# Patient Record
Sex: Female | Born: 1995 | Race: Black or African American | Hispanic: No | Marital: Single | Smoking: Never smoker
Health system: Southern US, Community
[De-identification: ages and names within clinical notes are randomized; demographics above are authoritative.]

## PROBLEM LIST (undated history)

## (undated) NOTE — *Deleted (*Deleted)
Meet the Provider Zoom Sessions      [facility] is now offering FREE monthly 1-hour virtual Zoom sessions for new, current, and prospective patients.        During these sessions, you can:   Learn about our practice, model of care, services   Get answers to questions about pregnancy and birth during COVID   Pick your provider's brain about anything else!    Sessions will be hosted by [facility] Nurse Practitioners, Physician Assistants, Physicians and Midwives          No registration required      2021 Dates:      All at 6pm     October 21st     November 18th   December 16th     January 20th  February 17th    To join one of these meetings, a few minutes before it is set to start:     Copy/paste the link into your web browser:  [email or web address]    OR  Scan the QR code below (open up your camera and point towards QR code; click on tab that pops up on your phone ("zoom")     

---

## 2018-03-23 NOTE — L&D Delivery Note (Signed)
OB/GYN Faculty Practice Delivery Note  Brenda Harvey is a 23 y.o. [id number] s/p VAVD at [redacted]w[redacted]d. She was admitted for IOL due to deceleration on NST, decreased FM and postdating.   ROM: 1h 63m with clear fluid GBS Status:  --Henderson Cloud (12/15 0311) Maximum Maternal Temperature: 98.7   Labor Progress: . Patient arrived at 1 cm dilation and was induced with cytotec x2 doses.   Delivery Date/Time: 03/15/2019 at [phone] Delivery: Called to room and patient was complete and pushing. Attending physician Dr Ernestina Patches called to the room due to recurrent prolonged fetal decelerations to the 60-70s. Verbal informed consent for VAVD obtained from MOB. Kiwi vacuum applied per manufacturers instructions. Head delivered in direct OA position with 3 contractions, no pop-offs. Nuchal cord present, reduced at the perineum. Vacuum removed and shoulder and body delivered in usual fashion. Infant with spontaneous cry, placed on mother's abdomen, dried and stimulated. Cord clamped x 2 after 1-minute delay, and cut by FOB. Cord blood drawn. Placenta delivered spontaneously with gentle cord traction. Fundus firm with massage and Pitocin. Labia, perineum, vagina, and cervix inspected with 2nd degree perineal laceration requiring repair w/ 3-0 Vicryl rapide.   Placenta: spontaneous, intact, 3 vessel cord Complications: fetal decelerations requiring VAVD Lacerations: 2nd degree perineal laceration requiring repair w/ 3-0 Vicryl rapide EBL: 182cc Analgesia: epidural, lidocaine   Infant: APGAR (1 MIN): 8   APGAR (5 MINS):  9   Weight: pending 1 hr skin to skin  Demetrius Revel, MD PGY-3

## 2018-08-04 LAB — OB RESULTS CONSOLE GC/CHLAMYDIA
Chlamydia: NEGATIVE
Gonorrhea: NEGATIVE

## 2018-08-04 LAB — HIV ANTIBODY (ROUTINE TESTING W REFLEX): HIV Screen 4th Generation wRfx: NONREACTIVE

## 2018-08-04 LAB — OB RESULTS CONSOLE ABO/RH: RH Type: POSITIVE

## 2018-08-04 LAB — OB RESULTS CONSOLE PLATELET COUNT: Platelets: 348

## 2018-08-04 LAB — OB RESULTS CONSOLE RPR: RPR: NONREACTIVE

## 2018-08-04 LAB — OB RESULTS CONSOLE HGB/HCT, BLOOD
HCT: 41 (ref 29–41)
Hemoglobin: 13.6

## 2018-08-04 LAB — OB RESULTS CONSOLE ANTIBODY SCREEN: Antibody Screen: NEGATIVE

## 2018-08-04 LAB — OB RESULTS CONSOLE RUBELLA ANTIBODY, IGM: Rubella: IMMUNE

## 2018-10-25 ENCOUNTER — Encounter: Payer: Self-pay | Admitting: Obstetrics and Gynecology

## 2018-12-06 ENCOUNTER — Encounter: Payer: Self-pay | Admitting: Advanced Practice Midwife

## 2018-12-06 ENCOUNTER — Other Ambulatory Visit: Payer: Self-pay

## 2018-12-06 ENCOUNTER — Ambulatory Visit (INDEPENDENT_AMBULATORY_CARE_PROVIDER_SITE_OTHER): Payer: Medicaid Other | Admitting: Advanced Practice Midwife

## 2018-12-06 VITALS — BP 118/73 | HR 104 | Temp 98.3°F | Ht 61.0 in | Wt 125.8 lb

## 2018-12-06 DIAGNOSIS — Z3A26 26 weeks gestation of pregnancy: Secondary | ICD-10-CM

## 2018-12-06 DIAGNOSIS — Z3490 Encounter for supervision of normal pregnancy, unspecified, unspecified trimester: Secondary | ICD-10-CM | POA: Insufficient documentation

## 2018-12-06 DIAGNOSIS — Z3482 Encounter for supervision of other normal pregnancy, second trimester: Secondary | ICD-10-CM | POA: Diagnosis not present

## 2018-12-06 DIAGNOSIS — D573 Sickle-cell trait: Secondary | ICD-10-CM

## 2018-12-06 DIAGNOSIS — Z833 Family history of diabetes mellitus: Secondary | ICD-10-CM

## 2018-12-06 DIAGNOSIS — O99012 Anemia complicating pregnancy, second trimester: Secondary | ICD-10-CM

## 2018-12-06 HISTORY — DX: Encounter for supervision of normal pregnancy, unspecified, unspecified trimester: Z34.90

## 2018-12-06 MED ORDER — BLOOD PRESSURE KIT DEVI: 1.0000 | 0 refills | Status: DC

## 2018-12-06 MED ORDER — PRENATE MINI 18-0.6-0.4-350 MG PO CAPS: 1.0000 | ORAL_CAPSULE | Freq: Every day | ORAL | 11 refills | Status: DC

## 2018-12-06 NOTE — Progress Notes (Addendum)
NEW OB xfer from [facility].  Needs Rx for PNV.  Declined FLU.

## 2018-12-06 NOTE — Progress Notes (Signed)
Subjective:   Brenda Harvey is a 23 y.o. G2P0010 at 61w1dby early ultrasound being seen today for her first obstetrical visit.  Her obstetrical history is significant for SAB x 1 and has Supervision of normal pregnancy, antepartum on their problem list.. Patient does intend to breast feed. Pregnancy history fully reviewed.  Patient reports no complaints.  HISTORY: OB History  Gravida Para Term Preterm AB Living  2 0 0 0 1 0  SAB TAB Ectopic Multiple Live Births  1 0 0 0 0    # Outcome Date GA Lbr Len/2nd Weight Sex Delivery Anes PTL Lv  2 Current           1 SAB  687w0d        History reviewed. No pertinent past medical history. History reviewed. No pertinent surgical history. Family History  Problem Relation Age of Onset  . Diabetes Mother   . Miscarriages / Stillbirths Sister   . Diabetes Maternal Uncle    Social History   Tobacco Use  . Smoking status: Never Smoker  . Smokeless tobacco: Never Used  Substance Use Topics  . Alcohol use: Never    Frequency: Never  . Drug use: Never   No Known Allergies No current outpatient medications on file prior to visit.   No current facility-administered medications on file prior to visit.    Exam   Vitals:   12/06/18 1020 12/06/18 1341  BP:  118/73  Pulse:  (!) 104  Temp:  98.3 F (36.8 C)  Weight:  125 lb 12.8 oz (57.1 kg)  Height: _0  (1.549 m)    Fetal Heart Rate (bpm): 154  VS reviewed, nursing note reviewed,  Constitutional: well developed, well nourished, no distress HEENT: normocephalic HEART: normal rate, heart sounds, regular rhythm RESP: normal effort, lung sounds clear and equal bilaterally Abdomen: soft Neuro: alert and oriented x 3 Skin: warm, dry Psych: affect normal Pelvic deferred: [place] earlier [place] Assessment:   Pregnancy: G2P0010 Patient Active Problem List   Diagnosis Date Noted  . Supervision of normal pregnancy, antepartum 12/06/2018     Plan:  1.  Encounter for supervision of normal pregnancy, antepartum, unspecified gravidity --Anticipatory guidance about next visits/weeks of pregnancy given.  --Reviewed safety, visitor policy, and COVID testing for scheduled IOL or and C/S and for active labor admission.  Reassurance about COVID-19 with young healthy women according to current data. Discussed possible changes to visits, including televisits, that may occur due to COVID-19.  The office remains open if pt needs to be seen and MAU is open 24 hours/day for OB emergencies.  - Enroll Patient in Babyscripts - Babyscripts Schedule Optimization - Blood Pressure Monitoring (BLOOD PRESSURE KIT) DEVI; 1 kit by Does not apply route once a week. Check BP Weekly. Large Cuff.  DX: Z13.6  Z34.86  O09.0  Dispense: 1 kit; Refill: 0 - Culture, OB Urine - Genetic Screening; Future --Pt to have genetic screening drawn with glucose testing.  - OB RESULTS CONSOLE RPR - OB RESULTS CONSOLE Rubella Antibody - OB RESULTS CONSOLE ABO/Rh - OB RESULTS CONSOLE Antibody Screen - OB RESULTS CONSOLE Hemoglobin and hematocrit, blood - OB RESULTS CONSOLE PLATELET COUNT - HIV Antibody (routine testing w rflx) - OB RESULTS CONSOLE GC/Chlamydia - [place]FM OB COMP + 14 WK; Future - Glucose tolerance, 2 hours; Future - Prenat-FeCbn-FeAsp-Meth-FA-DHA (PRENATE MINI) 18-0.6-0.4-350 MG CAPS; Take 1 capsule by mouth daily.  Dispense: 30 capsule;  Refill: 11  2. Family history of diabetes mellitus (DM) --Pt meets criteria for early glucose screen but is now 26 weeks. Will schedule routine glucose screening this week.  - Glucose tolerance, 2 hours; Future   Initial labs reviewed from prenatal records.  Pt reports discussion of genetic screening including nuchal translucency but does not believe any tests were done.   Some records brought by pt, but will request full prenatal record. Pt reports receiving care at same practice in [place] until 13 weeks. Rx written for PNV  Discussed and offered genetic screening options, including Quad screen/AFP, NIPS testing, and option to decline testing. Benefits/risks/alternatives reviewed. Pt aware that anatomy US is form of genetic screening with lower accuracy in detecting trisomies than blood work.  Pt chooses genetic screening today. NIPS: ordered. Ultrasound discussed; fetal anatomic survey: ordered. Problem list reviewed and updated. The nature of [facility] with multiple MDs and other Advanced Practice Providers was explained to patient; also emphasized that residents, students are part of our team. Routine obstetric precautions reviewed.  Return in about 4 weeks (around 01/03/2019).   Fatima Blank, CNM 12/06/18 2:39 PM

## 2018-12-06 NOTE — Patient Instructions (Signed)
Third Trimester of Pregnancy The third trimester is from week 28 through week 40 (months 7 through 9). The third trimester is a time when the unborn baby (fetus) is growing rapidly. At the end of the ninth month, the fetus is about 20 inches in length and weighs 6-10 pounds. Body changes during your third trimester Your body will continue to go through many changes during pregnancy. The changes vary from woman to woman. During the third trimester:  Your weight will continue to increase. You can expect to gain 25-35 pounds (11-16 kg) by the end of the pregnancy.  You may begin to get stretch marks on your hips, abdomen, and breasts.  You may urinate more often because the fetus is moving lower into your pelvis and pressing on your bladder.  You may develop or continue to have heartburn. This is caused by increased hormones that slow down muscles in the digestive tract.  You may develop or continue to have constipation because increased hormones slow digestion and cause the muscles that push waste through your intestines to relax.  You may develop hemorrhoids. These are swollen veins (varicose veins) in the rectum that can itch or be painful.  You may develop swollen, bulging veins (varicose veins) in your legs.  You may have increased body aches in the pelvis, back, or thighs. This is due to weight gain and increased hormones that are relaxing your joints.  You may have changes in your hair. These can include thickening of your hair, rapid growth, and changes in texture. Some women also have hair loss during or after pregnancy, or hair that feels dry or thin. Your hair will most likely return to normal after your baby is born.  Your breasts will continue to grow and they will continue to become tender. A yellow fluid (colostrum) may leak from your breasts. This is the first milk you are producing for your baby.  Your belly button may stick out.  You may notice more swelling in your hands,  face, or ankles.  You may have increased tingling or numbness in your hands, arms, and legs. The skin on your belly may also feel numb.  You may feel short of breath because of your expanding uterus.  You may have more problems sleeping. This can be caused by the size of your belly, increased need to urinate, and an increase in your body's metabolism.  You may notice the fetus "dropping," or moving lower in your abdomen (lightening).  You may have increased vaginal discharge.  You may notice your joints feel loose and you may have pain around your pelvic bone. What to expect at prenatal visits You will have prenatal exams every 2 weeks until week 36. Then you will have weekly prenatal exams. During a routine prenatal visit:  You will be weighed to make sure you and the baby are growing normally.  Your blood pressure will be taken.  Your abdomen will be measured to track your baby's growth.  The fetal heartbeat will be listened to.  Any test results from the previous visit will be discussed.  You may have a cervical check near your due date to see if your cervix has softened or thinned (effaced).  You will be tested for Group B streptococcus. This happens between 35 and 37 weeks. Your health care provider may ask you:  What your birth plan is.  How you are feeling.  If you are feeling the baby move.  If you have had any abnormal   symptoms, such as leaking fluid, bleeding, severe headaches, or abdominal cramping.  If you are using any tobacco products, including cigarettes, chewing tobacco, and electronic cigarettes.  If you have any questions. Other tests or screenings that may be performed during your third trimester include:  Blood tests that check for low iron levels (anemia).  Fetal testing to check the health, activity level, and growth of the fetus. Testing is done if you have certain medical conditions or if there are problems during the pregnancy.  Nonstress test  (NST). This test checks the health of your baby to make sure there are no signs of problems, such as the baby not getting enough oxygen. During this test, a belt is placed around your belly. The baby is made to move, and its heart rate is monitored during movement. What is false labor? False labor is a condition in which you feel small, irregular tightenings of the muscles in the womb (contractions) that usually go away with rest, changing position, or drinking water. These are called Braxton Hicks contractions. Contractions may last for hours, days, or even weeks before true labor sets in. If contractions come at regular intervals, become more frequent, increase in intensity, or become painful, you should see your health care provider. What are the signs of labor?  Abdominal cramps.  Regular contractions that start at 10 minutes apart and become stronger and more frequent with time.  Contractions that start on the top of the uterus and spread down to the lower abdomen and back.  Increased pelvic pressure and dull back pain.  A watery or bloody mucus discharge that comes from the vagina.  Leaking of amniotic fluid. This is also known as your "water breaking." It could be a slow trickle or a gush. Let your health care provider know if it has a color or strange odor. If you have any of these signs, call your health care provider right away, even if it is before your due date. Follow these instructions at home: Medicines  Follow your health care provider's instructions regarding medicine use. Specific medicines may be either safe or unsafe to take during pregnancy.  Take a prenatal vitamin that contains at least 600 micrograms (mcg) of folic acid.  If you develop constipation, try taking a stool softener if your health care provider approves. Eating and drinking   Eat a balanced diet that includes fresh fruits and vegetables, whole grains, good sources of protein such as meat, eggs, or tofu,  and low-fat dairy. Your health care provider will help you determine the amount of weight gain that is right for you.  Avoid raw meat and uncooked cheese. These carry germs that can cause birth defects in the baby.  If you have low calcium intake from food, talk to your health care provider about whether you should take a daily calcium supplement.  Eat four or five small meals rather than three large meals a day.  Limit foods that are high in fat and processed sugars, such as fried and sweet foods.  To prevent constipation: ? Drink enough fluid to keep your urine clear or pale yellow. ? Eat foods that are high in fiber, such as fresh fruits and vegetables, whole grains, and beans. Activity  Exercise only as directed by your health care provider. Most women can continue their usual exercise routine during pregnancy. Try to exercise for 30 minutes at least 5 days a week. Stop exercising if you experience uterine contractions.  Avoid heavy lifting.  Do   not exercise in extreme heat or humidity, or at high altitudes.  Wear low-heel, comfortable shoes.  Practice good posture.  You may continue to have sex unless your health care provider tells you otherwise. Relieving pain and discomfort  Take frequent breaks and rest with your legs elevated if you have leg cramps or low back pain.  Take warm sitz baths to soothe any pain or discomfort caused by hemorrhoids. Use hemorrhoid cream if your health care provider approves.  Wear a good support bra to prevent discomfort from breast tenderness.  If you develop varicose veins: ? Wear support pantyhose or compression stockings as told by your healthcare provider. ? Elevate your feet for 15 minutes, 3-4 times a day. Prenatal care  Write down your questions. Take them to your prenatal visits.  Keep all your prenatal visits as told by your health care provider. This is important. Safety  Wear your seat belt at all times when driving.  Make  a list of emergency phone numbers, including numbers for family, friends, the hospital, and police and fire departments. General instructions  Avoid cat litter boxes and soil used by cats. These carry germs that can cause birth defects in the baby. If you have a cat, ask someone to clean the litter box for you.  Do not travel far distances unless it is absolutely necessary and only with the approval of your health care provider.  Do not use hot tubs, steam rooms, or saunas.  Do not drink alcohol.  Do not use any products that contain nicotine or tobacco, such as cigarettes and e-cigarettes. If you need help quitting, ask your health care provider.  Do not use any medicinal herbs or unprescribed drugs. These chemicals affect the formation and growth of the baby.  Do not douche or use tampons or scented sanitary pads.  Do not cross your legs for long periods of time.  To prepare for the arrival of your baby: ? Take prenatal classes to understand, practice, and ask questions about labor and delivery. ? Make a trial run to the hospital. ? Visit the hospital and tour the maternity area. ? Arrange for maternity or paternity leave through employers. ? Arrange for family and friends to take care of pets while you are in the hospital. ? Purchase a rear-facing car seat and make sure you know how to install it in your car. ? Pack your hospital bag. ? Prepare the baby's nursery. Make sure to remove all pillows and stuffed animals from the baby's crib to prevent suffocation.  Visit your dentist if you have not gone during your pregnancy. Use a soft toothbrush to brush your teeth and be gentle when you floss. Contact a health care provider if:  You are unsure if you are in labor or if your water has broken.  You become dizzy.  You have mild pelvic cramps, pelvic pressure, or nagging pain in your abdominal area.  You have lower back pain.  You have persistent nausea, vomiting, or diarrhea.   You have an unusual or bad smelling vaginal discharge.  You have pain when you urinate. Get help right away if:  Your water breaks before 37 weeks.  You have regular contractions less than 5 minutes apart before 37 weeks.  You have a fever.  You are leaking fluid from your vagina.  You have spotting or bleeding from your vagina.  You have severe abdominal pain or cramping.  You have rapid weight loss or weight gain.  You have   shortness of breath with chest pain.  You notice sudden or extreme swelling of your face, hands, ankles, feet, or legs.  Your baby makes fewer than 10 movements in 2 hours.  You have severe headaches that do not go away when you take medicine.  You have vision changes. Summary  The third trimester is from week 28 through week 40, months 7 through 9. The third trimester is a time when the unborn baby (fetus) is growing rapidly.  During the third trimester, your discomfort may increase as you and your baby continue to gain weight. You may have abdominal, leg, and back pain, sleeping problems, and an increased need to urinate.  During the third trimester your breasts will keep growing and they will continue to become tender. A yellow fluid (colostrum) may leak from your breasts. This is the first milk you are producing for your baby.  False labor is a condition in which you feel small, irregular tightenings of the muscles in the womb (contractions) that eventually go away. These are called Braxton Hicks contractions. Contractions may last for hours, days, or even weeks before true labor sets in.  Signs of labor can include: abdominal cramps; regular contractions that start at 10 minutes apart and become stronger and more frequent with time; watery or bloody mucus discharge that comes from the vagina; increased pelvic pressure and dull back pain; and leaking of amniotic fluid. This information is not intended to replace advice given to you by your health  care provider. Make sure you discuss any questions you have with your health care provider. Document Released: 03/03/2001 Document Revised: 06/30/2018 Document Reviewed: 04/14/2016 Elsevier Patient Education  2020 [employer]  

## 2018-12-07 ENCOUNTER — Other Ambulatory Visit: Payer: Medicaid Other

## 2018-12-07 DIAGNOSIS — Z833 Family history of diabetes mellitus: Secondary | ICD-10-CM

## 2018-12-07 DIAGNOSIS — Z3490 Encounter for supervision of normal pregnancy, unspecified, unspecified trimester: Secondary | ICD-10-CM

## 2018-12-08 LAB — CBC
Hematocrit: 33.4 % — ABNORMAL LOW (ref 34.0–46.6)
Hemoglobin: 11.1 g/dL (ref 11.1–15.9)
MCH: 31.1 pg (ref 26.6–33.0)
MCHC: 33.2 g/dL (ref 31.5–35.7)
MCV: 94 fL (ref 79–97)
Platelets: 265 10*3/uL (ref 150–450)
RBC: 3.57 x10E6/uL — ABNORMAL LOW (ref 3.77–5.28)
RDW: 11.5 % — ABNORMAL LOW (ref 11.7–15.4)
WBC: 10.5 10*3/uL (ref 3.4–10.8)

## 2018-12-08 LAB — HIV ANTIBODY (ROUTINE TESTING W REFLEX): HIV Screen 4th Generation wRfx: NONREACTIVE

## 2018-12-08 LAB — GLUCOSE TOLERANCE, 2 HOURS W/ 1HR
Glucose, 1 hour: 129 mg/dL (ref 65–179)
Glucose, 2 hour: 97 mg/dL (ref 65–152)
Glucose, Fasting: 77 mg/dL (ref 65–91)

## 2018-12-08 LAB — RPR: RPR Ser Ql: NONREACTIVE

## 2018-12-08 MED ORDER — BLOOD PRESSURE KIT DEVI: 1.0000 | 0 refills | Status: AC

## 2018-12-08 NOTE — Addendum Note (Signed)
Addended by: Tamela Oddi on: 12/08/2018 05:05 PM   Modules accepted: Orders

## 2018-12-09 LAB — URINE CULTURE, OB REFLEX

## 2018-12-09 LAB — CULTURE, OB URINE

## 2018-12-16 ENCOUNTER — Encounter: Payer: Self-pay | Admitting: Obstetrics and Gynecology

## 2018-12-19 ENCOUNTER — Ambulatory Visit (HOSPITAL_COMMUNITY)
Admission: RE | Admit: 2018-12-19 | Discharge: 2018-12-19 | Disposition: A | Discharge status: Home / Self Care | Payer: Medicaid Other | Source: Ambulatory Visit | Attending: Obstetrics and Gynecology | Admitting: Obstetrics and Gynecology

## 2018-12-19 ENCOUNTER — Other Ambulatory Visit: Payer: Self-pay

## 2018-12-19 ENCOUNTER — Encounter: Payer: Self-pay | Admitting: Obstetrics and Gynecology

## 2018-12-19 DIAGNOSIS — Z3A28 28 weeks gestation of pregnancy: Secondary | ICD-10-CM

## 2018-12-19 DIAGNOSIS — Z3490 Encounter for supervision of normal pregnancy, unspecified, unspecified trimester: Secondary | ICD-10-CM | POA: Diagnosis present

## 2018-12-19 DIAGNOSIS — Z148 Genetic carrier of other disease: Secondary | ICD-10-CM

## 2018-12-19 DIAGNOSIS — Z363 Encounter for antenatal screening for malformations: Secondary | ICD-10-CM | POA: Diagnosis not present

## 2018-12-19 DIAGNOSIS — O359XX0 Maternal care for (suspected) fetal abnormality and damage, unspecified, not applicable or unspecified: Secondary | ICD-10-CM | POA: Diagnosis not present

## 2019-01-03 ENCOUNTER — Encounter: Payer: Self-pay | Admitting: Obstetrics

## 2019-01-03 ENCOUNTER — Telehealth (INDEPENDENT_AMBULATORY_CARE_PROVIDER_SITE_OTHER): Payer: Medicaid Other | Admitting: Obstetrics

## 2019-01-03 VITALS — BP 119/71 | HR 89

## 2019-01-03 DIAGNOSIS — O26893 Other specified pregnancy related conditions, third trimester: Secondary | ICD-10-CM

## 2019-01-03 DIAGNOSIS — Z3490 Encounter for supervision of normal pregnancy, unspecified, unspecified trimester: Secondary | ICD-10-CM

## 2019-01-03 DIAGNOSIS — M549 Dorsalgia, unspecified: Secondary | ICD-10-CM

## 2019-01-03 DIAGNOSIS — Z3A30 30 weeks gestation of pregnancy: Secondary | ICD-10-CM

## 2019-01-03 MED ORDER — COMFORT FIT MATERNITY SUPP SM MISC: 0 refills | Status: DC

## 2019-01-03 NOTE — Progress Notes (Signed)
Patient reports fetal movement, denies pain. 

## 2019-01-03 NOTE — Progress Notes (Signed)
TELEHEALTH OBSTETRICS PRENATAL VIRTUAL VIDEO VISIT ENCOUNTER NOTE  Provider location: Center for [facility] [place]   I connected with Brenda Harvey on 01/03/19 at 11:00 AM EDT by OB MyChart Video Encounter at home and verified that I am speaking with the correct person using two identifiers.   I discussed the limitations, risks, security and privacy concerns of performing an evaluation and management service virtually and the availability of in person appointments. I also discussed with the patient that there may be a patient responsible charge related to this service. The patient expressed understanding and agreed to proceed. Subjective:  Brenda Harvey is a 23 y.o. G2P0010 at [redacted]w[redacted]d being seen today for ongoing prenatal care.  She is currently monitored for the following issues for this low-risk pregnancy and has Supervision of normal pregnancy, antepartum and Sickle cell trait (HCC) on their problem list.  Patient reports backache.  Contractions: Not present. Vag. Bleeding: None.  Movement: Present. Denies any leaking of fluid.   The following portions of the patient's history were reviewed and updated as appropriate: allergies, current medications, past family history, past medical history, past social history, past surgical history and problem list.   Objective:   Vitals:   01/03/19 1112  BP: 119/71  Pulse: 89    Fetal Status:     Movement: Present     General:  Alert, oriented and cooperative. Patient is in no acute distress.  Respiratory: Normal respiratory effort, no problems with respiration noted  Mental Status: Normal mood and affect. Normal behavior. Normal judgment and thought content.  Rest of physical exam deferred due to type of encounter  Imaging: [place] Mfm Ob Comp + 14 Wk  Result Date: 12/19/2018 ----------------------------------------------------------------------  OBSTETRICS REPORT                       (Signed Final 12/19/2018 01:36 pm)  ---------------------------------------------------------------------- Patient Info  ID #:       [id number]                          D.O.B.:  1995/11/02 (22 yrs)  Name:       Brenda Harvey                Visit Date: 12/19/2018 08:58 am ---------------------------------------------------------------------- Performed By  Performed By:     Sandi Mealy        Ref. Address:     [address]                    RD[address]                                                             Rd                                                             Jacky Kindle  Attending:        Ma Rings MD         [facility]  Referred By:      Wilmer Floor LEFTWICH-                    KIRBY CNM ---------------------------------------------------------------------- Orders   #  Description                          Code         Ordered By   1  [place] MFM OB COMP + 14 WK               76805.01     LISA LEFTWICH-                                                        KIRBY  ----------------------------------------------------------------------   #  Order #                    Accession #                 Episode #   1  [id number]                  [id number]                  [id number]  ---------------------------------------------------------------------- Indications   Fetal abnormality - other known or suspected   O35.9XX0   Encounter for antenatal screening for          Z36.3   malformations (Low Risk NIPS)   Genetic carrier (Sickle Cell trait)            Z14.8   [redacted] weeks gestation of pregnancy                Z3A.28  ---------------------------------------------------------------------- Fetal Evaluation  Num Of Fetuses:         1  Fetal Heart Rate(bpm):  143  Cardiac Activity:       Observed  Presentation:           Cephalic  Placenta:               Anterior  P. Cord Insertion:      Visualized  Amniotic Fluid  AFI FV:      Within normal limits                               Largest Pocket(cm)                              8.01 ---------------------------------------------------------------------- Biometry  BPD:      70.2  mm     G. Age:  28w 1d         45  %    CI:        82.01   %    70 - 86                                                          FL/HC:      20.6   %  18.8 - 20.6  HC:      244.6  mm     G. Age:  26w 4d          2  %    HC/AC:      0.99        1.05 - 1.21  AC:      247.4  mm     G. Age:  29w 0d         72  %    FL/BPD:     71.9   %    71 - 87  FL:       50.5  mm     G. Age:  27w 1d         14  %    FL/AC:      20.4   %    20 - 24  HUM:      45.9  mm     G. Age:  27w 0d         26  %  LV:        3.9  mm  Est. FW:    1168  gm      2 lb 9 oz     39  % ---------------------------------------------------------------------- OB History  Gravidity:    2         Term:   0         SAB:   1  Living:       0 ---------------------------------------------------------------------- Gestational Age  LMP:           28w 0d        Date:  06/06/18                 EDD:   03/13/19  U/S Today:     27w 5d                                        EDD:   03/15/19  Best:          28w 0d     Det. By:  LMP  (06/06/18)          EDD:   03/13/19 ---------------------------------------------------------------------- Anatomy  Cranium:               Appears normal         Aortic Arch:            Not well visualized  Cavum:                 Appears normal         Ductal Arch:            Appears normal  Ventricles:            Appears normal         Diaphragm:              Appears normal  Choroid Plexus:        Appears normal         Stomach:                Appears normal, left  sided  Cerebellum:            Not well visualized    Abdomen:                Appears normal  Posterior Fossa:       Not well visualized    Abdominal Wall:         Appears nml (cord                                                                         insert, abd wall)  Nuchal Fold:           Not applicable (>35    Cord Vessels:           Appears normal ([redacted]                         wks GA)                                        vessel cord)  Face:                  Appears normal         Kidneys:                Not well visualized                         (orbits and profile)  Lips:                  Appears normal         Bladder:                Appears normal  Thoracic:              Appears normal         Spine:                  Not well visualized  Heart:                 Appears normal         Upper Extremities:      Appears normal                         (4CH, axis, and                         situs)  RVOT:                  Appears normal         Lower Extremities:      Appears normal  LVOT:                  Appears normal  Other:  Female gender. Nasal bone visualized. Technically difficult due to fetal          position. ---------------------------------------------------------------------- Comments  This patient was seen for a detailed fetal anatomy scan.  The  patient recently [place]  She denies any significant past medical history and denies  any problems in her current pregnancy.  She is a carrier for  the sickle cell trait.  She had a cell free DNA test earlier in her pregnancy which  indicated a low risk for trisomy 5621, 8018, and 13. A female fetus is  predicted.  She was informed that the fetal growth and amniotic fluid  level were appropriate for her gestational age.  There were no obvious fetal anomalies noted on today's  ultrasound exam.  Today's exam was limited due to her  advanced gestational age.  The patient was informed that anomalies may be missed due  to technical limitations. If the fetus is in a suboptimal position  or maternal habitus is increased, visualization of the fetus in  the maternal uterus may be impaired.  Follow up as indicated. ----------------------------------------------------------------------                    Ma RingsVictor Fang, MD Electronically Signed Final Report   12/19/2018 01:36 pm ----------------------------------------------------------------------   Assessment and Plan:  Pregnancy: G2P0010 at 5569w1d 1. Encounter for supervision of normal pregnancy, antepartum, unspecified gravidity  2. Backache symptom Rx: - Elastic Bandages & Supports (COMFORT FIT MATERNITY SUPP SM) MISC; Wear as directed.  Dispense: 1 each; Refill: 0  Preterm labor symptoms and general obstetric precautions including but not limited to vaginal bleeding, contractions, leaking of fluid and fetal movement were reviewed in detail with the patient. I discussed the assessment and treatment plan with the patient. The patient was provided an opportunity to ask questions and all were answered. The patient agreed with the plan and demonstrated an understanding of the instructions. The patient was advised to call back or seek an in-person office evaluation/[facility] for any urgent or concerning symptoms. Please refer to After Visit Summary for other counseling recommendations.   I provided 10 minutes of face-to-face time during this encounter.  Return in about 2 weeks (around 01/17/2019) for MyChart.    Coral Ceoharles Astraea Gaughran, MD [facility], [facility] 01/03/2019

## 2019-01-06 ENCOUNTER — Encounter: Payer: Medicaid Other | Admitting: Obstetrics

## 2019-01-17 ENCOUNTER — Encounter: Payer: Self-pay | Admitting: Obstetrics

## 2019-01-17 ENCOUNTER — Telehealth (INDEPENDENT_AMBULATORY_CARE_PROVIDER_SITE_OTHER): Payer: Medicaid Other | Admitting: Obstetrics

## 2019-01-17 DIAGNOSIS — Z3493 Encounter for supervision of normal pregnancy, unspecified, third trimester: Secondary | ICD-10-CM

## 2019-01-17 DIAGNOSIS — Z3490 Encounter for supervision of normal pregnancy, unspecified, unspecified trimester: Secondary | ICD-10-CM

## 2019-01-17 DIAGNOSIS — Z3A32 32 weeks gestation of pregnancy: Secondary | ICD-10-CM

## 2019-01-17 NOTE — Progress Notes (Signed)
TELEHEALTH OBSTETRICS PRENATAL VIRTUAL VIDEO VISIT ENCOUNTER NOTE  Provider location: [facility] for [facility] [place]   I connected with Brenda Harvey on 01/17/19 at  2:30 PM EDT by OB MyChart Video Encounter at home and verified that I am speaking with the correct person using two identifiers.   I discussed the limitations, risks, security and privacy concerns of performing an evaluation and management service virtually and the availability of in person appointments. I also discussed with the patient that there may be a patient responsible charge related to this service. The patient expressed understanding and agreed to proceed. Subjective:  Brenda Harvey is a 23 y.o. G2P0010 at [redacted]w[redacted]d being seen today for ongoing prenatal care.  She is currently monitored for the following issues for this low-risk pregnancy and has Supervision of normal pregnancy, antepartum and Sickle cell trait (HCC) on their problem list.  Patient reports sharp RUQ pain that lasted ~ 1 minute, and has not returned .  Contractions: Not present. Vag. Bleeding: None.  Movement: Present. Denies any leaking of fluid.   The following portions of the patient's history were reviewed and updated as appropriate: allergies, current medications, past family history, past medical history, past social history, past surgical history and problem list.   Objective:   Vitals:   01/17/19 1420  BP: 122/72  Pulse: 86    Fetal Status:     Movement: Present     General:  Alert, oriented and cooperative. Patient is in no acute distress.  Respiratory: Normal respiratory effort, no problems with respiration noted  Mental Status: Normal mood and affect. Normal behavior. Normal judgment and thought content.  Rest of physical exam deferred due to type of encounter  Imaging: [place] Mfm Ob Comp + 14 Wk  Result Date: 12/19/2018 ----------------------------------------------------------------------  OBSTETRICS REPORT                        (Signed Final 12/19/2018 01:36 pm) ---------------------------------------------------------------------- Patient Info  ID #:       [id number]                          D.O.B.:  28-Dec-1995 (22 yrs)  Name:       Brenda Harvey                Visit Date: 12/19/2018 08:58 am ---------------------------------------------------------------------- Performed By  Performed By:     Sandi Mealy        Ref. Address:     [address]                    RD[address]                                                             Rd                                                             Jacky Kindle  Attending:        Ma Rings MD  [facility]  Referred By:      Wilmer FloorLISA A LEFTWICH-                    KIRBY CNM ---------------------------------------------------------------------- Orders   #  Description                          Code         Ordered By   1  US MFM OB COMP + 14 WK               76805.01     LISA LEFTWICH-                                                        KIRBY  ----------------------------------------------------------------------   #  Order #                    Accession #                 Episode #   1  [id number]                  [id number]                  [id number]  ---------------------------------------------------------------------- Indications   Fetal abnormality - other known or suspected   O35.9XX0   Encounter for antenatal screening for          Z36.3   malformations (Low Risk NIPS)   Genetic carrier (Sickle Cell trait)            Z14.8   [redacted] weeks gestation of pregnancy                Z3A.28  ---------------------------------------------------------------------- Fetal Evaluation  Num Of Fetuses:         1  Fetal Heart Rate(bpm):  143  Cardiac Activity:       Observed  Presentation:           Cephalic  Placenta:               Anterior  P. Cord Insertion:      Visualized  Amniotic Fluid  AFI FV:       Within normal limits                              Largest Pocket(cm)                              8.01 ---------------------------------------------------------------------- Biometry  BPD:      70.2  mm     G. Age:  28w 1d         45  %    CI:        82.01   %  70 - 86                                                          FL/HC:      20.6   %    18.8 - 20.6  HC:      244.6  mm     G. Age:  26w 4d          2  %    HC/AC:      0.99        1.05 - 1.21  AC:      247.4  mm     G. Age:  29w 0d         72  %    FL/BPD:     71.9   %    71 - 87  FL:       50.5  mm     G. Age:  27w 1d         14  %    FL/AC:      20.4   %    20 - 24  HUM:      45.9  mm     G. Age:  27w 0d         26  %  LV:        3.9  mm  Est. FW:    1168  gm      2 lb 9 oz     39  % ---------------------------------------------------------------------- OB History  Gravidity:    2         Term:   0         SAB:   1  Living:       0 ---------------------------------------------------------------------- Gestational Age  LMP:           28w 0d        Date:  06/06/18                 EDD:   03/13/19  U/S Today:     27w 5d                                        EDD:   03/15/19  Best:          28w 0d     Det. By:  LMP  (06/06/18)          EDD:   03/13/19 ---------------------------------------------------------------------- Anatomy  Cranium:               Appears normal         Aortic Arch:            Not well visualized  Cavum:                 Appears normal         Ductal Arch:            Appears normal  Ventricles:            Appears normal         Diaphragm:              Appears normal  Choroid Plexus:  Appears normal         Stomach:                Appears normal, left                                                                        sided  Cerebellum:            Not well visualized    Abdomen:                Appears normal  Posterior Fossa:       Not well visualized    Abdominal Wall:         Appears nml (cord                                                                         insert, abd wall)  Nuchal Fold:           Not applicable (>20    Cord Vessels:           Appears normal ([redacted]                         wks GA)                                        vessel cord)  Face:                  Appears normal         Kidneys:                Not well visualized                         (orbits and profile)  Lips:                  Appears normal         Bladder:                Appears normal  Thoracic:              Appears normal         Spine:                  Not well visualized  Heart:                 Appears normal         Upper Extremities:      Appears normal                         (4CH, axis, and                         situs)  RVOT:                  Appears normal         Lower Extremities:      Appears normal  LVOT:                  Appears normal  Other:  Female gender. Nasal bone visualized. Technically difficult due to fetal          position. ---------------------------------------------------------------------- Comments  This patient was seen for a detailed fetal anatomy scan.  The  patient recently transferred her care from practice in [place].  She denies any significant past medical history and denies  any problems in her current pregnancy.  She is a carrier for  the sickle cell trait.  She had a cell free DNA test earlier in her pregnancy which  indicated a low risk for trisomy 74, 56, and 13. A female fetus is  predicted.  She was informed that the fetal growth and amniotic fluid  level were appropriate for her gestational age.  There were no obvious fetal anomalies noted on today's  ultrasound exam.  Today's exam was limited due to her  advanced gestational age.  The patient was informed that anomalies may be missed due  to technical limitations. If the fetus is in a suboptimal position  or maternal habitus is increased, visualization of the fetus in  the maternal uterus may be impaired.  Follow up as indicated.  ----------------------------------------------------------------------                   Ma Rings, MD Electronically Signed Final Report   12/19/2018 01:36 pm ----------------------------------------------------------------------   Assessment and Plan:  Pregnancy: G2P0010 at [redacted]w[redacted]d 1. Encounter for supervision of normal pregnancy, antepartum, unspecified gravidity   Preterm labor symptoms and general obstetric precautions including but not limited to vaginal bleeding, contractions, leaking of fluid and fetal movement were reviewed in detail with the patient. I discussed the assessment and treatment plan with the patient. The patient was provided an opportunity to ask questions and all were answered. The patient agreed with the plan and demonstrated an understanding of the instructions. The patient was advised to call back or seek an in-person office evaluation/go to MAU at [facility] for any urgent or concerning symptoms. Please refer to After Visit Summary for other counseling recommendations.   I provided 10 minutes of face-to-face time during this encounter.  Return in about 2 weeks (around 01/31/2019) for MyChart.  Future Appointments  Date Time Provider Department Center  01/17/2019  2:30 PM Brock Bad, MD [facility] None    Coral Ceo, MD Center for [facility], [facility] 01/17/2019

## 2019-01-17 NOTE — Progress Notes (Signed)
Pt is on the phone preparing for virtual visit with provider. [redacted]w[redacted]d.  

## 2019-01-20 ENCOUNTER — Telehealth: Payer: Self-pay

## 2019-01-20 NOTE — Telephone Encounter (Signed)
Contacted pt to get more info to fill out paperwork, no answer, left vm

## 2019-01-25 ENCOUNTER — Telehealth: Payer: Self-pay | Admitting: *Deleted

## 2019-01-25 NOTE — Telephone Encounter (Signed)
Attempt to contact pt regarding accomodation paperwork.  LM on VM to contact office.

## 2019-01-31 ENCOUNTER — Encounter: Payer: Self-pay | Admitting: Obstetrics

## 2019-01-31 ENCOUNTER — Telehealth (INDEPENDENT_AMBULATORY_CARE_PROVIDER_SITE_OTHER): Payer: Medicaid Other | Admitting: Obstetrics

## 2019-01-31 VITALS — BP 123/78 | HR 96

## 2019-01-31 DIAGNOSIS — Z3480 Encounter for supervision of other normal pregnancy, unspecified trimester: Secondary | ICD-10-CM

## 2019-01-31 DIAGNOSIS — Z3483 Encounter for supervision of other normal pregnancy, third trimester: Secondary | ICD-10-CM

## 2019-01-31 DIAGNOSIS — D573 Sickle-cell trait: Secondary | ICD-10-CM

## 2019-01-31 DIAGNOSIS — Z3A34 34 weeks gestation of pregnancy: Secondary | ICD-10-CM

## 2019-01-31 DIAGNOSIS — Z833 Family history of diabetes mellitus: Secondary | ICD-10-CM

## 2019-01-31 NOTE — Progress Notes (Signed)
Virtual Visit via Telephone Note  I connected with Brenda Harvey on 01/31/19 at  2:30 PM EST by telephone and verified that I am speaking with the correct person using two identifiers.  No complaints today per pt

## 2019-01-31 NOTE — Progress Notes (Signed)
   [place] VIRTUAL VIDEO VISIT ENCOUNTER NOTE  Provider location: [facility] for [facility] [place]   I connected with Marlei Demirjian on 01/31/19 at  2:30 PM EST by OB MyChart Video Encounter at home and verified that I am speaking with the correct person using two identifiers.   I discussed the limitations, risks, security and privacy concerns of performing an evaluation and management service virtually and the availability of in person appointments. I also discussed with the patient that there may be a patient responsible charge related to this service. The patient expressed understanding and agreed to proceed. Subjective:  Brenda Harvey is a 23 y.o. G2P0010 at [redacted]w[redacted]d being seen today for ongoing prenatal care.  She is currently monitored for the following issues for this low-risk pregnancy and has Supervision of normal pregnancy, antepartum and Sickle cell trait ([place]) on their problem list.  Patient reports no complaints.  Contractions: Not present. Vag. Bleeding: None.  Movement: Present. Denies any leaking of fluid.   The following portions of the patient's history were reviewed and updated as appropriate: allergies, current medications, past family history, past medical history, past social history, past surgical history and problem list.   Objective:   Vitals:   01/31/19 1432  BP: 123/78  Pulse: 96    Fetal Status:     Movement: Present     General:  Alert, oriented and cooperative. Patient is in no acute distress.  Respiratory: Normal respiratory effort, no problems with respiration noted  Mental Status: Normal mood and affect. Normal behavior. Normal judgment and thought content.  Rest of physical exam deferred due to type of encounter  Imaging: No results found.  Assessment and Plan:  Pregnancy: G2P0010 at [redacted]w[redacted]d  1. Supervision of other normal pregnancy, antepartum  2. Family history of diabetes mellitus (DM)  3. Sickle cell trait  ([place])   Preterm labor symptoms and general obstetric precautions including but not limited to vaginal bleeding, contractions, leaking of fluid and fetal movement were reviewed in detail with the patient. I discussed the assessment and treatment plan with the patient. The patient was provided an opportunity to ask questions and all were answered. The patient agreed with the plan and demonstrated an understanding of the instructions. The patient was advised to call back or seek an in-person office evaluation/go to [facility]U at [facility] for any urgent or concerning symptoms. Please refer to After Visit Summary for other counseling recommendations.   I provided 10 minutes of face-to-face time during this encounter.  Return in about 2 weeks (around 02/14/2019) for ROB.  No future appointments.  Baltazar Najjar, MD Center for [facility], [facility] 01/31/2019

## 2019-02-14 ENCOUNTER — Telehealth (INDEPENDENT_AMBULATORY_CARE_PROVIDER_SITE_OTHER): Payer: Medicaid Other | Admitting: Advanced Practice Midwife

## 2019-02-14 ENCOUNTER — Encounter: Payer: Self-pay | Admitting: Advanced Practice Midwife

## 2019-02-14 VITALS — BP 121/78 | HR 93

## 2019-02-14 DIAGNOSIS — Z3A36 36 weeks gestation of pregnancy: Secondary | ICD-10-CM

## 2019-02-14 DIAGNOSIS — U071 COVID-19: Secondary | ICD-10-CM

## 2019-02-14 DIAGNOSIS — O98513 Other viral diseases complicating pregnancy, third trimester: Secondary | ICD-10-CM

## 2019-02-14 DIAGNOSIS — Z3480 Encounter for supervision of other normal pregnancy, unspecified trimester: Secondary | ICD-10-CM

## 2019-02-14 HISTORY — DX: Other viral diseases complicating pregnancy, third trimester: O98.513

## 2019-02-14 HISTORY — DX: COVID-19: U07.1

## 2019-02-14 MED ORDER — ALBUTEROL SULFATE HFA 108 (90 BASE) MCG/ACT IN AERS: 2.0000 | INHALATION_SPRAY | Freq: Four times a day (QID) | RESPIRATORY_TRACT | 0 refills | Status: DC | PRN

## 2019-02-14 NOTE — Progress Notes (Signed)
   [place] VIRTUAL VIDEO VISIT ENCOUNTER NOTE  Provider location: [facility] at [place]   I connected with Brenda Harvey on 02/14/19 at  2:15 PM EST by Chart Video Encounter at home and verified that I am speaking with the correct person using two identifiers.   I discussed the limitations, risks, security and privacy concerns of performing an evaluation and management service virtually and the availability of in person appointments. I also discussed with the patient that there may be a patient responsible charge related to this service. The patient expressed understanding and agreed to proceed. Subjective:  Brenda Harvey is a 23 y.o. G2P0010 at [redacted]w[redacted]d being seen today for ongoing prenatal care.  She is currently monitored for the following issues for this low-risk pregnancy and has Supervision of normal pregnancy, antepartum and Sickle cell trait ([place]) on their problem list.  Patient reports mild URI symptoms plus no smell/taste.  Contractions: Not present. Vag. Bleeding: None.  Movement: Present. Denies any leaking of fluid.   The following portions of the patient's history were reviewed and updated as appropriate: allergies, current medications, past family history, past medical history, past social history, past surgical history and problem list.   Objective:   Vitals:   02/14/19 1416  BP: 121/78  Pulse: 93    Fetal Status:     Movement: Present     General:  Alert, oriented and cooperative. Patient is in no acute distress.  Respiratory: Normal respiratory effort, no problems with respiration noted  Mental Status: Normal mood and affect. Normal behavior. Normal judgment and thought content.  Rest of physical exam deferred due to type of encounter  Imaging: No results found.  Assessment and Plan:  Pregnancy: G2P0010 at [redacted]w[redacted]d 1. Lab test positive for detection of COVID-19 virus --pt boyfriend positive first, she has mild cold  symptoms plus no taste/smell. --Reassurance provided that symptoms are likely to remain mild, reviewed where to go if symptoms worsen, and safe medications in pregnancy. --Rx for albuterol inhaler PRN. Pt does not currently need and may pick up if she becomes short of breath.  2. COVID-19 affecting pregnancy in third trimester --Today's visit changed to virtual, pt called/sent message to notify office of her positive test  3. Supervision of other normal pregnancy, antepartum --Pt reports good fetal movement, denies cramping, LOF, or vaginal bleeding --Anticipatory guidance about next visits/weeks of pregnancy given. --Next appt in 2 weeks, at 38 weeks, will need to do GBS/GC at that time.   Term labor symptoms and general obstetric precautions including but not limited to vaginal bleeding, contractions, leaking of fluid and fetal movement were reviewed in detail with the patient. I discussed the assessment and treatment plan with the patient. The patient was provided an opportunity to ask questions and all were answered. The patient agreed with the plan and demonstrated an understanding of the instructions. The patient was advised to call back or seek an in-person office evaluation/go to [facility]U at [facility] for any urgent or concerning symptoms. Please refer to After Visit Summary for other counseling recommendations.   I provided 15 minutes of face-to-face time during this encounter.  Return in about 2 weeks (around 02/28/2019).  No future appointments.  Fatima Blank, [place] for [employer], [place]

## 2019-02-16 ENCOUNTER — Other Ambulatory Visit: Payer: Self-pay

## 2019-02-16 ENCOUNTER — Inpatient Hospital Stay (HOSPITAL_COMMUNITY)
Admission: AD | Admit: 2019-02-16 | Discharge: 2019-02-16 | Disposition: A | Discharge status: Home / Self Care | Payer: Medicaid Other | Attending: Obstetrics and Gynecology | Admitting: Obstetrics and Gynecology

## 2019-02-16 ENCOUNTER — Encounter (HOSPITAL_COMMUNITY): Payer: Self-pay | Admitting: *Deleted

## 2019-02-16 DIAGNOSIS — R42 Dizziness and giddiness: Secondary | ICD-10-CM | POA: Insufficient documentation

## 2019-02-16 DIAGNOSIS — O26893 Other specified pregnancy related conditions, third trimester: Secondary | ICD-10-CM | POA: Diagnosis not present

## 2019-02-16 DIAGNOSIS — Z833 Family history of diabetes mellitus: Secondary | ICD-10-CM | POA: Diagnosis not present

## 2019-02-16 DIAGNOSIS — O99891 Other specified diseases and conditions complicating pregnancy: Secondary | ICD-10-CM | POA: Insufficient documentation

## 2019-02-16 DIAGNOSIS — Z3A33 33 weeks gestation of pregnancy: Secondary | ICD-10-CM

## 2019-02-16 DIAGNOSIS — U071 COVID-19: Secondary | ICD-10-CM | POA: Diagnosis not present

## 2019-02-16 DIAGNOSIS — R109 Unspecified abdominal pain: Secondary | ICD-10-CM

## 2019-02-16 DIAGNOSIS — O98513 Other viral diseases complicating pregnancy, third trimester: Secondary | ICD-10-CM | POA: Diagnosis not present

## 2019-02-16 DIAGNOSIS — O26899 Other specified pregnancy related conditions, unspecified trimester: Secondary | ICD-10-CM

## 2019-02-16 DIAGNOSIS — Z3A36 36 weeks gestation of pregnancy: Secondary | ICD-10-CM | POA: Insufficient documentation

## 2019-02-16 DIAGNOSIS — Z3689 Encounter for other specified antenatal screening: Secondary | ICD-10-CM | POA: Insufficient documentation

## 2019-02-16 LAB — URINALYSIS, ROUTINE W REFLEX MICROSCOPIC
Bacteria, UA: NONE SEEN
Bilirubin Urine: NEGATIVE
Glucose, UA: NEGATIVE mg/dL
Ketones, ur: 5 mg/dL — AB
Nitrite: NEGATIVE
Protein, ur: NEGATIVE mg/dL
Specific Gravity, Urine: 1.011 (ref 1.005–1.030)
pH: 6 (ref 5.0–8.0)

## 2019-02-16 MED ORDER — LACTATED RINGERS IV SOLN
Freq: Once | INTRAVENOUS | Status: AC
  Administered 2019-02-16: 15:00:00 via INTRAVENOUS

## 2019-02-16 NOTE — MAU Note (Addendum)
.   Brenda Harvey is a 23 y.o. at [redacted]w[redacted]d here in MAU reporting:abdominal pain that started this morning around 10am   Denies any VB or LOF. +FM. Pt complains to dizziness at times  LMP Onset of complaint: 10am Pain score: 8 Vitals:   02/16/19 1409  BP: 120/81  Pulse: (!) 118  Resp: 16  Temp: 98 F (36.7 C)  SpO2: 99%     [id number] Lab orders placed from triage:

## 2019-02-16 NOTE — MAU Provider Note (Signed)
History     CSN: [id number]  Arrival date and time: 02/16/19 1352   First Provider Initiated Contact with Patient 02/16/19 1447      Chief Complaint  Patient presents with  . Abdominal Pain  . Dizziness   HPI Brenda Harvey 23 y.o.  [redacted]w[redacted]d Comes to MAU today with lower abdominal pain that started after she ate breakfast today.  Awakened at 10 am and having some lower abdominal pain.  Ate 2 bowls of cereal and had milk to drink.  Pain worsened.  She was having nausea, dizziness and continued abdominal pain.  No bleeding.  No vaginal leaking.  Spent time resting on the floor at home.  Writhing when she came into MAU.    FOB has tested positive for Covid and client had a test at [facility] on 02-09-19 that was positive Covid.  In negative pressure room in MAU.  Staff wearing appropriate PPE.  Patient Active Problem List   Diagnosis Date Noted  . Lab test positive for detection of COVID-19 virus 02/14/2019  . COVID-19 affecting pregnancy in third trimester 02/14/2019  . Supervision of normal pregnancy, antepartum 12/06/2018  . Sickle cell trait ([place] 12/06/2018  ]  OB History    Gravida  2   Para  0   Term      Preterm      AB  1   Living        SAB  1   TAB      Ectopic      Multiple      Live Births              History reviewed. No pertinent past medical history.  History reviewed. No pertinent surgical history.  Family History  Problem Relation Age of Onset  . Diabetes Mother   . Miscarriages / Stillbirths Sister   . Diabetes Maternal Uncle     Social History   Tobacco Use  . Smoking status: Never Smoker  . Smokeless tobacco: Never Used  Substance Use Topics  . Alcohol use: Never    Frequency: Never  . Drug use: Never    Allergies: No Known Allergies  Medications Prior to Admission  Medication Sig Dispense Refill Last Dose  . Prenat-FeCbn-FeAsp-Meth-FA-DHA (PRENATE MINI) 18-0.6-0.4-350 MG CAPS Take 1 capsule by mouth daily. 30 capsule 11  02/16/2019 at Unknown time  . albuterol (VENTOLIN HFA) 108 (90 Base) MCG/ACT inhaler Inhale 2 puffs into the lungs every 6 (six) hours as needed for wheezing or shortness of breath. 6.7 g 0   . Blood Pressure Monitoring (BLOOD PRESSURE KIT) DEVI 1 kit by Does not apply route once a week. Check BP Weekly. Large Cuff.  DX: Z13.6  Z34.86  O09.0 (Patient not taking: Reported on 01/31/2019) 1 kit 0   . Elastic Bandages & Supports (COMFORT FIT MATERNITY SUPP SM) MISC Wear as directed. (Patient not taking: Reported on 01/31/2019) 1 each 0     Review of Systems  Constitutional: Negative for fever.  Respiratory: Negative for cough, chest tightness and shortness of breath.   Gastrointestinal: Positive for abdominal pain and nausea. Negative for diarrhea and vomiting.  Genitourinary: Negative for dysuria, vaginal bleeding and vaginal discharge.  Musculoskeletal: Negative for back pain.   Physical Exam   Blood pressure 120/81, pulse (!) 118, temperature 98 F (36.7 C), resp. rate 16, last menstrual period 06/06/2018, SpO2 99 %.  Physical Exam  Nursing note and vitals reviewed. Constitutional: She is oriented to person, place,  and time. She appears well-developed and well-nourished.  HENT:  Head: Normocephalic.  Eyes: EOM are normal.  Neck: Neck supple.  Cardiovascular: Normal rate, regular rhythm and normal heart sounds.  Respiratory: Effort normal and breath sounds normal. No respiratory distress. She has no wheezes.  GI: Soft. Bowel sounds are normal. There is abdominal tenderness. There is no rebound and no guarding.  Mild lower abdominal tenderness bilaterally.  No pubic symphysis tenderness.  Genitourinary:    Genitourinary Comments: Fetal baseline 140 with moderate variability.  15x15 accels noted.  No decelerations.  No contractions. Reactive NST Cervix checked by RN - closed, 50%   Musculoskeletal: Normal range of motion.  Neurological: She is alert and oriented to person, place, and  time.  Skin: Skin is warm and dry.  Psychiatric: She has a normal mood and affect.    MAU Course  Procedures Labs Results for orders placed or performed during the hospital encounter of 02/16/19 (from the past 24 hour(s))  Urinalysis, Routine w reflex microscopic     Status: Abnormal   Collection Time: 02/16/19  3:13 PM  Result Value Ref Range   Color, Urine YELLOW YELLOW   APPearance HAZY (A) CLEAR   Specific Gravity, Urine 1.011 1.005 - 1.030   pH 6.0 5.0 - 8.0   Glucose, UA NEGATIVE NEGATIVE mg/dL   Hgb urine dipstick LARGE (A) NEGATIVE   Bilirubin Urine NEGATIVE NEGATIVE   Ketones, ur 5 (A) NEGATIVE mg/dL   Protein, ur NEGATIVE NEGATIVE mg/dL   Nitrite NEGATIVE NEGATIVE   Leukocytes,Ua TRACE (A) NEGATIVE   RBC / HPF 0-5 0 - 5 RBC/hpf   WBC, UA 0-5 0 - 5 WBC/hpf   Bacteria, UA NONE SEEN NONE SEEN   Squamous Epithelial / LPF 0-5 0 - 5   Mucus PRESENT    Hyaline Casts, UA PRESENT      MDM Unable to urinate on arrival to MAU.  RN checked client - FT. Started IVF - 1000cc LR.  After 300 cc infused, client much calmer.  Reviewed contractions with her.  Baby has been moving well today. Client much better after fluids infused.  Abdominal pain has resolved.  Is not in labor.  Likely she was dehydrated.  Checking urine to make sure there is no UTI.  Advised to continue to drink fluids and rest as she is still recovering from Covid. Urinalysis reviewed - urine obtained after IVF infused.  Likely client was dehydrated - hyaline casts present.    Assessment and Plan  Abdominal pain in pregnancy - resolved at time of discharge Reactive NST Positive Covid test on 02-09-19  Plan IVF infused. Keep your appointments in the office as scheduled. Drink at least 8 8-oz glasses of water every day. Eat regular meals and snacks with protein every time you eat. Return if your baby is not moving well, or if you water breaks or if you are having vaginal bleeding. Stay quarantined at home for  at least 3 more days.  [name] 02/16/2019, 2:48 PM

## 2019-02-16 NOTE — Discharge Instructions (Signed)
Keep your appointments in the office as scheduled. Drink at least 8 8-oz glasses of water every day. Eat regular meals and snacks with protein every time you eat. Return if your baby is not moving well, or if you water breaks or if you are having vaginal bleeding. Stay quarantined at home for at least 3 more days.

## 2019-02-27 ENCOUNTER — Other Ambulatory Visit: Payer: Self-pay

## 2019-02-27 ENCOUNTER — Telehealth (INDEPENDENT_AMBULATORY_CARE_PROVIDER_SITE_OTHER): Payer: Medicaid Other | Admitting: Obstetrics and Gynecology

## 2019-02-27 ENCOUNTER — Encounter: Payer: Self-pay | Admitting: Obstetrics and Gynecology

## 2019-02-27 VITALS — BP 120/80 | HR 102 | Ht 61.0 in

## 2019-02-27 DIAGNOSIS — U071 COVID-19: Secondary | ICD-10-CM

## 2019-02-27 DIAGNOSIS — Z3A38 38 weeks gestation of pregnancy: Secondary | ICD-10-CM

## 2019-02-27 DIAGNOSIS — Z3400 Encounter for supervision of normal first pregnancy, unspecified trimester: Secondary | ICD-10-CM

## 2019-02-27 DIAGNOSIS — D573 Sickle-cell trait: Secondary | ICD-10-CM

## 2019-02-27 DIAGNOSIS — O98513 Other viral diseases complicating pregnancy, third trimester: Secondary | ICD-10-CM

## 2019-02-27 NOTE — Progress Notes (Signed)
   [place] VIRTUAL VIDEO VISIT ENCOUNTER NOTE  Provider location: [facility] [place] at [place]   I connected with Margrit Albergo on 02/27/19 at  1:45 PM EST by MyChart Video Encounter at home and verified that I am speaking with the correct person using two identifiers.   I discussed the limitations, risks, security and privacy concerns of performing an evaluation and management service virtually and the availability of in person appointments. I also discussed with the patient that there may be a patient responsible charge related to this service. The patient expressed understanding and agreed to proceed. Subjective:  Brenda Harvey is a 23 y.o. G2P0010 at [redacted]w[redacted]d being seen today for ongoing prenatal care.  She is currently monitored for the following issues for this low-risk pregnancy and has Supervision of normal pregnancy, antepartum; Sickle cell trait ([place]); Lab test positive for detection of COVID-19 virus; and COVID-19 affecting pregnancy in third trimester on their problem list.  Patient reports still wtih slight cough after COVID-19 diagnosis, otherwise feeling well.  Contractions: Not present. Vag. Bleeding: None.  Movement: Present. Denies any leaking of fluid.   The following portions of the patient's history were reviewed and updated as appropriate: allergies, current medications, past family history, past medical history, past social history, past surgical history and problem list.   Objective:   Vitals:   02/27/19 1329  BP: 120/80  Pulse: (!) 102  Height: 5\' 1"  (1.549 m)    Fetal Status:     Movement: Present     General:  Alert, oriented and cooperative. Patient is in no acute distress.  Respiratory: Normal respiratory effort, no problems with respiration noted  Mental Status: Normal mood and affect. Normal behavior. Normal judgment and thought content.  Rest of physical exam deferred due to type of encounter  Imaging: No results  found.  Assessment and Plan:  Pregnancy: G2P0010 at [redacted]w[redacted]d 1. Supervision of normal first pregnancy, antepartum Swabs next visit  2. Sickle cell trait ([place])  3. Lab test positive for detection of COVID-19 virus Positive 02/12/19 Slight cough today, no other symptoms  Term labor symptoms and general obstetric precautions including but not limited to vaginal bleeding, contractions, leaking of fluid and fetal movement were reviewed in detail with the patient. I discussed the assessment and treatment plan with the patient. The patient was provided an opportunity to ask questions and all were answered. The patient agreed with the plan and demonstrated an understanding of the instructions. The patient was advised to call back or seek an in-person office evaluation/go to MAU at [facility] for any urgent or concerning symptoms. Please refer to After Visit Summary for other counseling recommendations.   I provided 15 minutes of face-to-face time during this encounter.  Return in about 1 week (around 03/06/2019) for low OB, in person, 36 week swabs.  No future appointments.  Sloan Leiter, MD [facility] for [place], [place]

## 2019-02-27 NOTE — Progress Notes (Signed)
I connected with  Brenda Harvey on 02/27/19 by a video enabled telemedicine application and verified that I am speaking with the correct person using two identifiers.   I discussed the limitations of evaluation and management by telemedicine. The patient expressed understanding and agreed to proceed.  MyChart OB. Needs GBS.  Reports no problems today.

## 2019-03-07 ENCOUNTER — Ambulatory Visit (INDEPENDENT_AMBULATORY_CARE_PROVIDER_SITE_OTHER): Payer: Medicaid Other | Admitting: Obstetrics & Gynecology

## 2019-03-07 ENCOUNTER — Other Ambulatory Visit (HOSPITAL_COMMUNITY)
Admission: RE | Admit: 2019-03-07 | Discharge: 2019-03-07 | Disposition: A | Discharge status: Home / Self Care | Payer: Medicaid Other | Source: Ambulatory Visit | Attending: Obstetrics & Gynecology | Admitting: Obstetrics & Gynecology

## 2019-03-07 ENCOUNTER — Other Ambulatory Visit: Payer: Self-pay

## 2019-03-07 VITALS — BP 113/75 | HR 91 | Temp 97.5°F | Wt 135.6 lb

## 2019-03-07 DIAGNOSIS — Z23 Encounter for immunization: Secondary | ICD-10-CM

## 2019-03-07 DIAGNOSIS — Z3403 Encounter for supervision of normal first pregnancy, third trimester: Secondary | ICD-10-CM | POA: Diagnosis not present

## 2019-03-07 DIAGNOSIS — Z3A39 39 weeks gestation of pregnancy: Secondary | ICD-10-CM

## 2019-03-07 DIAGNOSIS — Z3400 Encounter for supervision of normal first pregnancy, unspecified trimester: Secondary | ICD-10-CM | POA: Insufficient documentation

## 2019-03-07 NOTE — Progress Notes (Signed)
   PRENATAL VISIT NOTE  Subjective:  Brenda Harvey is a 23 y.o. G2P0010 at [redacted]w[redacted]d being seen today for ongoing prenatal care.  She is currently monitored for the following issues for this low-risk pregnancy and has Supervision of normal pregnancy, antepartum; Sickle cell trait ([place]); Lab test positive for detection of COVID-19 virus; and COVID-19 affecting pregnancy in third trimester on their problem list.  Patient reports no complaints.  Contractions: Irritability. Vag. Bleeding: None.  Movement: Present. Denies leaking of fluid.   The following portions of the patient's history were reviewed and updated as appropriate: allergies, current medications, past family history, past medical history, past social history, past surgical history and problem list.   Objective:   Vitals:   03/07/19 1453  BP: 113/75  Pulse: 91  Temp: (!) 97.5 F (36.4 C)  Weight: 135 lb 9.6 oz (61.5 kg)    Fetal Status:     Movement: Present     General:  Alert, oriented and cooperative. Patient is in no acute distress.  Skin: Skin is warm and dry. No rash noted.   Cardiovascular: Normal heart rate noted  Respiratory: Normal respiratory effort, no problems with respiration noted  Abdomen: Soft, gravid, appropriate for gestational age.  Pain/Pressure: Absent     Pelvic: Cervical exam performed        Extremities: Normal range of motion.  Edema: None  Mental Status: Normal mood and affect. Normal behavior. Normal judgment and thought content.   Assessment and Plan:  Pregnancy: G2P0010 at [redacted]w[redacted]d 1. Supervision of normal first pregnancy, antepartum  - GBS - Cervicovaginal ancillary only( [place]) - Tdap vaccine greater than or equal to 7yo IM - Flu Vaccine QUAD 36+ mos IM (Fluarix, Quad PF)  Preterm labor symptoms and general obstetric precautions including but not limited to vaginal bleeding, contractions, leaking of fluid and fetal movement were reviewed in detail with the patient. Please refer to  After Visit Summary for other counseling recommendations.   No follow-ups on file.  No future appointments.  Emily Filbert, MD

## 2019-03-07 NOTE — Progress Notes (Signed)
Pt is here for ROB. [redacted]w[redacted]d.  

## 2019-03-08 LAB — CERVICOVAGINAL ANCILLARY ONLY
Chlamydia: NEGATIVE
Comment: NEGATIVE
Comment: NORMAL
Neisseria Gonorrhea: NEGATIVE

## 2019-03-09 LAB — STREP GP B NAA: Strep Gp B NAA: NEGATIVE

## 2019-03-14 ENCOUNTER — Inpatient Hospital Stay (HOSPITAL_COMMUNITY)
Admission: AD | Admit: 2019-03-14 | Discharge: 2019-03-17 | DRG: 807 | Disposition: A | Discharge status: Home / Self Care | Payer: Medicaid Other | Attending: Family Medicine | Admitting: Family Medicine

## 2019-03-14 ENCOUNTER — Other Ambulatory Visit: Payer: Self-pay

## 2019-03-14 ENCOUNTER — Encounter: Payer: Self-pay | Admitting: Obstetrics and Gynecology

## 2019-03-14 ENCOUNTER — Encounter (HOSPITAL_COMMUNITY): Payer: Self-pay | Admitting: Obstetrics and Gynecology

## 2019-03-14 ENCOUNTER — Ambulatory Visit (INDEPENDENT_AMBULATORY_CARE_PROVIDER_SITE_OTHER): Payer: Medicaid Other | Admitting: Obstetrics and Gynecology

## 2019-03-14 VITALS — BP 122/83 | HR 103 | Temp 98.7°F | Wt 137.2 lb

## 2019-03-14 DIAGNOSIS — Z3A40 40 weeks gestation of pregnancy: Secondary | ICD-10-CM

## 2019-03-14 DIAGNOSIS — D573 Sickle-cell trait: Secondary | ICD-10-CM | POA: Diagnosis present

## 2019-03-14 DIAGNOSIS — U071 COVID-19: Secondary | ICD-10-CM

## 2019-03-14 DIAGNOSIS — O9902 Anemia complicating childbirth: Secondary | ICD-10-CM | POA: Diagnosis present

## 2019-03-14 DIAGNOSIS — O480 Post-term pregnancy: Secondary | ICD-10-CM | POA: Diagnosis present

## 2019-03-14 DIAGNOSIS — O368130 Decreased fetal movements, third trimester, not applicable or unspecified: Secondary | ICD-10-CM | POA: Diagnosis present

## 2019-03-14 DIAGNOSIS — Z8619 Personal history of other infectious and parasitic diseases: Secondary | ICD-10-CM

## 2019-03-14 DIAGNOSIS — Z3400 Encounter for supervision of normal first pregnancy, unspecified trimester: Secondary | ICD-10-CM

## 2019-03-14 DIAGNOSIS — O98513 Other viral diseases complicating pregnancy, third trimester: Secondary | ICD-10-CM

## 2019-03-14 DIAGNOSIS — O99113 Other diseases of the blood and blood-forming organs and certain disorders involving the immune mechanism complicating pregnancy, third trimester: Secondary | ICD-10-CM

## 2019-03-14 LAB — CBC
HCT: 33.3 % — ABNORMAL LOW (ref 36.0–46.0)
Hemoglobin: 11.2 g/dL — ABNORMAL LOW (ref 12.0–15.0)
MCH: 28.9 pg (ref 26.0–34.0)
MCHC: 33.6 g/dL (ref 30.0–36.0)
MCV: 86 fL (ref 80.0–100.0)
Platelets: 268 10*3/uL (ref 150–400)
RBC: 3.87 MIL/uL (ref 3.87–5.11)
RDW: 12.8 % (ref 11.5–15.5)
WBC: 8.7 10*3/uL (ref 4.0–10.5)
nRBC: 0 % (ref 0.0–0.2)

## 2019-03-14 MED ORDER — LIDOCAINE HCL (PF) 1 % IJ SOLN
30.0000 mL | INTRAMUSCULAR | Status: AC | PRN
  Administered 2019-03-15: 30 mL via SUBCUTANEOUS
  Filled 2019-03-14: qty 30

## 2019-03-14 MED ORDER — OXYTOCIN 40 UNITS IN NORMAL SALINE INFUSION - SIMPLE MED
2.5000 [IU]/h | INTRAVENOUS | Status: DC
  Filled 2019-03-14: qty 1000

## 2019-03-14 MED ORDER — OXYCODONE-ACETAMINOPHEN 5-325 MG PO TABS: 2.0000 | ORAL_TABLET | ORAL | Status: DC | PRN

## 2019-03-14 MED ORDER — ZOLPIDEM TARTRATE 5 MG PO TABS: 5.0000 mg | ORAL_TABLET | Freq: Every evening | ORAL | Status: DC | PRN

## 2019-03-14 MED ORDER — SOD CITRATE-CITRIC ACID 500-334 MG/5ML PO SOLN: 30.0000 mL | ORAL | Status: DC | PRN

## 2019-03-14 MED ORDER — OXYCODONE-ACETAMINOPHEN 5-325 MG PO TABS: 1.0000 | ORAL_TABLET | ORAL | Status: DC | PRN

## 2019-03-14 MED ORDER — LACTATED RINGERS IV SOLN
500.0000 mL | INTRAVENOUS | Status: DC | PRN
  Administered 2019-03-15: 500 mL via INTRAVENOUS
  Administered 2019-03-15: 1000 mL via INTRAVENOUS

## 2019-03-14 MED ORDER — ONDANSETRON HCL 4 MG/2ML IJ SOLN
4.0000 mg | Freq: Four times a day (QID) | INTRAMUSCULAR | Status: DC | PRN
  Administered 2019-03-15: 4 mg via INTRAVENOUS
  Filled 2019-03-14 (×2): qty 2

## 2019-03-14 MED ORDER — LACTATED RINGERS IV SOLN: INTRAVENOUS | Status: DC

## 2019-03-14 MED ORDER — ACETAMINOPHEN 325 MG PO TABS: 650.0000 mg | ORAL_TABLET | ORAL | Status: DC | PRN

## 2019-03-14 MED ORDER — TERBUTALINE SULFATE 1 MG/ML IJ SOLN: 0.2500 mg | Freq: Once | INTRAMUSCULAR | Status: DC | PRN

## 2019-03-14 MED ORDER — OXYTOCIN BOLUS FROM INFUSION
500.0000 mL | Freq: Once | INTRAVENOUS | Status: AC
  Administered 2019-03-15: 500 mL via INTRAVENOUS

## 2019-03-14 MED ORDER — MISOPROSTOL 25 MCG QUARTER TABLET
25.0000 ug | ORAL_TABLET | ORAL | Status: DC | PRN
  Administered 2019-03-14 – 2019-03-15 (×2): 25 ug via VAGINAL
  Filled 2019-03-14 (×2): qty 1

## 2019-03-14 NOTE — H&P (Signed)
OBSTETRIC ADMISSION HISTORY AND PHYSICAL  Brenda Harvey is a 23 y.o. female G2P0010 with IUP at 18w1dby 9 wk [place] for IOL due to postdates and deceleration on NST and decreased FM. She reports +FMs, No LOF, no VB, no blurry vision, headaches or peripheral edema, and RUQ pain.  She plans on breast feeding. She is undecided about birth control, knows options.  [facility] Dating: By 9 wk [place]--->  Estimated Date of Delivery: 03/13/19  Sono:    '@[redacted]w[redacted]d' , CWD, normal anatomy, cephalic presentation, anterior placental lie, 1168g, 39% EFW   Prenatal History/Complications: Sickle cell trait  Covid +   Past Medical History: History reviewed. No pertinent past medical history.  Past Surgical History: History reviewed. No pertinent surgical history.  Obstetrical History: OB History    Gravida  2   Para  0   Term      Preterm      AB  1   Living        SAB  1   TAB      Ectopic      Multiple      Live Births              Social History: Social History   Socioeconomic History  . Marital status: Single    Spouse name: Not on file  . Number of children: Not on file  . Years of education: Not on file  . Highest education level: Not on file  Occupational History  . Occupation: unemployed  Tobacco Use  . Smoking status: Never Smoker  . Smokeless tobacco: Never Used  Substance and Sexual Activity  . Alcohol use: Never  . Drug use: Never  . Sexual activity: Yes  Other Topics Concern  . Not on file  Social History Narrative  . Not on file   Social Determinants of Health   Financial Resource Strain:   . Difficulty of Paying Living Expenses: Not on file  Food Insecurity:   . Worried About RCharity fundraiserin the Last Year: Not on file  . Ran Out of Food in the Last Year: Not on file  Transportation Needs:   . Lack of Transportation (Medical): Not on file  . Lack of Transportation (Non-Medical): Not on file   Physical Activity:   . Days of Exercise per Week: Not on file  . Minutes of Exercise per Session: Not on file  Stress:   . Feeling of Stress : Not on file  Social Connections:   . Frequency of Communication with Friends and Family: Not on file  . Frequency of Social Gatherings with Friends and Family: Not on file  . Attends Religious Services: Not on file  . Active Member of Clubs or Organizations: Not on file  . Attends CArchivistMeetings: Not on file  . Marital Status: Not on file    Family History: Family History  Problem Relation Age of Onset  . Diabetes Mother   . Miscarriages / Stillbirths Sister   . Diabetes Maternal Uncle     Allergies: No Known Allergies  Medications Prior to Admission  Medication Sig Dispense Refill Last Dose  . albuterol (VENTOLIN HFA) 108 (90 Base) MCG/ACT inhaler Inhale 2 puffs into the lungs every 6 (six) hours as needed for wheezing or shortness of breath. (Patient not taking: Reported on 03/14/2019) 6.7 g 0   . Blood Pressure Monitoring (BLOOD PRESSURE KIT) DEVI 1 kit  by Does not apply route once a week. Check BP Weekly. Large Cuff.  DX: Z13.6  Z34.86  O09.0 1 kit 0   . Elastic Bandages & Supports (COMFORT FIT MATERNITY SUPP SM) MISC Wear as directed. (Patient not taking: Reported on 03/14/2019) 1 each 0   . Prenat-FeCbn-FeAsp-Meth-FA-DHA (PRENATE MINI) 18-0.6-0.4-350 MG CAPS Take 1 capsule by mouth daily. 30 capsule 11      Review of Systems   All systems reviewed and negative except as stated in HPI  Blood pressure 121/82, pulse 92, temperature 98.5 F (36.9 C), resp. rate 16, height '5\' 1"'  (1.549 m), weight 61.2 kg, last menstrual period 06/06/2018. General appearance: alert, cooperative and no distress Lungs: clear to auscultation bilaterally Heart: regular rate and rhythm Abdomen: soft, non-tender; bowel sounds normal Pelvic: 1/50//-2 Extremities: Homans sign is negative, no sign of DVT DTR's 2+ Presentation:  cephalic Fetal monitoringBaseline: 135 bpm, Variability: Good {> 6 bpm), Accelerations: Reactive and Decelerations: Absent Uterine activityNone    Prenatal labs: ABO, Rh: --/--/PENDING (12/22 2025) Antibody: PENDING (12/22 2025) Rubella: Immune (05/14 0000) RPR: Non Reactive (09/16 0914)  HBsAg:   nonreactive HIV: Non Reactive (09/16 0914)  GBS: --Henderson Cloud (12/15 0311)  1 hr Glucola normal Genetic screening  Low risk Anatomy US normal   Nursing Staff Provider  Office Location  [facility] Dating  9 week [place]  Language   English Anatomy US  wnl  Flu Vaccine   given 03/07/19 Genetic Screen  NIPS: Low risk female  AFP: late     TDaP vaccine   given 03/07/19 Hgb A1C or  GTT Early  Meets criteria but late Third trimester 2 hour wnl  Rhogam     LAB RESULTS   Feeding Plan  Breastfeed Blood Type AB/Positive/-- (05/14 0000)   Contraception  Undecided Antibody Negative (05/14 0000)  Circumcision  No Rubella Immune (05/14 0000)  Pediatrician   Undecided RPR Non Reactive (09/16 0914)   Support Person  Rashaad FOB HBsAg     Prenatal Classes  Yes HIV Non Reactive (09/16 0914)  BTL Consent  GBS --Henderson Cloud (12/15 0311)negtative  VBAC Consent  Pap Records requested    Hgb Electro  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  BP Cuff  ordered 12/06/2018 CF neg    SMA 3 copies    Waterbirth  '[ ]'  Class '[ ]'  Consent '[ ]'  CNM visit    Prenatal Transfer Tool  Maternal Diabetes: No Genetic Screening: Normal Maternal Ultrasounds/Referrals: Normal Fetal Ultrasounds or other Referrals:  None Maternal Substance Abuse:  No Significant Maternal Medications:  None Significant Maternal Lab Results: Group B Strep negative  Results for orders placed or performed during the hospital encounter of 03/14/19 (from the past 24 hour(s))  Type and screen   Collection Time: 03/14/19  8:25 PM  Result Value Ref Range   ABO/RH(D) PENDING    Antibody Screen PENDING    Sample Expiration      03/17/2019,2359 Performed at [facility],  [address] [place], [place] [address]   CBC   Collection Time: 03/14/19  8:30 PM  Result Value Ref Range   WBC 8.7 4.0 - 10.5 K/uL   RBC 3.87 3.87 - 5.11 MIL/uL   Hemoglobin 11.2 (L) 12.0 - 15.0 g/dL   HCT 33.3 (L) 36.0 - 46.0 %   MCV 86.0 80.0 - 100.0 fL   MCH 28.9 26.0 - 34.0 pg   MCHC 33.6 30.0 - 36.0 g/dL   RDW 12.8 11.5 - 15.5 %   Platelets  268 150 - 400 K/uL   nRBC 0.0 0.0 - 0.2 %    Patient Active Problem List   Diagnosis Date Noted  . Post-dates pregnancy 03/14/2019  . Lab test positive for detection of COVID-19 virus 02/14/2019  . COVID-19 affecting pregnancy in third trimester 02/14/2019  . Supervision of normal pregnancy, antepartum 12/06/2018  . Sickle cell trait ([place]) 12/06/2018    Assessment/Plan:  Sabra Boniface is a 24 y.o. G2P0010 at 63w1dhere for IOL 2/2 decel on NST and decreased FM  #Labor:cytotec>foley>pitocin #Pain: Per request #FWB: Cat 1   FChristin Fudge CNM  03/14/2019, 9:01 PM   I personally saw and evaluated the patient, performing the key elements of the service. I developed and verified the management plan that is described in the resident's/student's note, and I agree with the content with my edits above. VSS, HRR&R, Resp unlabored, Legs neg.  FNigel Berthold CNM 03/14/2019 9:01 PM

## 2019-03-14 NOTE — Progress Notes (Addendum)
Pt triaged and Jorje Guild NP saw pt and confirmed vtx by ultrasound. Pt sent from office for decreased FM and variables. Will be direct admit for induction. Jorje Guild NP called Dr Trinidad Curet and gave report and MD will put in orders for pt. Pt had covid a month ago with cold like symptoms but is fine now.

## 2019-03-14 NOTE — MAU Note (Signed)
Had decreased FM today in office and some decels. Was sent here for ultrasound. Some ctxs that are not painful. Some mucousy discharge. Was not checked today in office. Good FM tonight

## 2019-03-14 NOTE — Progress Notes (Signed)
   PRENATAL VISIT NOTE  Subjective:  Brenda Harvey is a 23 y.o. G2P0010 at [redacted]w[redacted]d being seen today for ongoing prenatal care.  She is currently monitored for the following issues for this low-risk pregnancy and has Supervision of normal pregnancy, antepartum; Sickle cell trait ([place]); Lab test positive for detection of COVID-19 virus; and COVID-19 affecting pregnancy in third trimester on their problem list.  Patient reports had small amount of mucous liquid come out last week, none since. Some contractions last night, not so much today..  Contractions: Irritability. Vag. Bleeding: None.  Movement: Present. Denies leaking of fluid.   The following portions of the patient's history were reviewed and updated as appropriate: allergies, current medications, past family history, past medical history, past social history, past surgical history and problem list.   Objective:   Vitals:   03/14/19 1634  BP: 122/83  Pulse: (!) 103  Temp: 98.7 F (37.1 C)  Weight: 137 lb 3.2 oz (62.2 kg)    Fetal Status: Fetal Heart Rate (bpm): NST    Movement: Present     General:  Alert, oriented and cooperative. Patient is in no acute distress.  Skin: Skin is warm and dry. No rash noted.   Cardiovascular: Normal heart rate noted  Respiratory: Normal respiratory effort, no problems with respiration noted  Abdomen: Soft, gravid, appropriate for gestational age.  Pain/Pressure: Present     Pelvic: Cervical exam deferred        Extremities: Normal range of motion.  Edema: None  Mental Status: Normal mood and affect. Normal behavior. Normal judgment and thought content.   Assessment and Plan:  Pregnancy: G2P0010 at [redacted]w[redacted]d  1. Supervision of normal first pregnancy, antepartum IOL scheduled today Orders placed NST today with one decel, to MAU for monitoring Reviewed induction, COVID testing, etc.  2. Sickle cell trait ([place])  3. Lab test positive for detection of COVID-19 virus Dx 02/12/19  Term labor  symptoms and general obstetric precautions including but not limited to vaginal bleeding, contractions, leaking of fluid and fetal movement were reviewed in detail with the patient. Please refer to After Visit Summary for other counseling recommendations.   Return in about 5 weeks (around 04/18/2019) for post partum check.  No future appointments.  Sloan Leiter, MD

## 2019-03-14 NOTE — Patient Instructions (Signed)
Use the following websites (and others) to help learn more about your contraception options and find the method that is right for you!  - The Centers for Disease Control (CDC) website: https://www.cdc.gov/reproductivehealth/contraception/index.htm  - [facility] website: https://www.plannedparenthood.org/learn/birth-control  - Bedsider.org: https://www.bedsider.org/methods   Go to bedsider.org for more information!  Contraception Choices Contraception, also called birth control, refers to methods or devices that prevent pregnancy. Hormonal methods Contraceptive implant A contraceptive implant is a thin, plastic tube that contains a hormone. It is inserted into the upper part of the arm. It can remain in place for up to 3 years. Progestin-only injections Progestin-only injections are injections of progestin, a synthetic form of the hormone progesterone. They are given every 3 months by a health care provider. Birth control pills Birth control pills are pills that contain hormones that prevent pregnancy. They must be taken once a day, preferably at the same time each day. Birth control patch The birth control patch contains hormones that prevent pregnancy. It is placed on the skin and must be changed once a week for three weeks and removed on the fourth week. A prescription is needed to use this method of contraception. Vaginal ring A vaginal ring contains hormones that prevent pregnancy. It is placed in the vagina for three weeks and removed on the fourth week. After that, the process is repeated with a new ring. A prescription is needed to use this method of contraception. Emergency contraceptive Emergency contraceptives prevent pregnancy after unprotected sex. They come in pill form and can be taken up to 5 days after sex. They work best the sooner they are taken after having sex. Most emergency contraceptives are available without a prescription. This method should not be used as  your only form of birth control. Barrier methods Female condom A female condom is a thin sheath that is worn over the penis during sex. Condoms keep sperm from going inside a woman's body. They can be used with a spermicide to increase their effectiveness. They should be disposed after a single use. Female condom A female condom is a soft, loose-fitting sheath that is put into the vagina before sex. The condom keeps sperm from going inside a woman's body. They should be disposed after a single use.  Intrauterine contraception Intrauterine device (IUD) An IUD is a T-shaped device that is put in a woman's uterus. There are two types:  Hormone IUD.This type contains progestin, a synthetic form of the hormone progesterone. This type can stay in place for 3-5 years.  Copper IUD.This type is wrapped in copper wire. It can stay in place for 10 years.  Permanent methods of contraception Female tubal ligation In this method, a woman's fallopian tubes are sealed, tied, or blocked during surgery to prevent eggs from traveling to the uterus.  Female sterilization This is a procedure to tie off the tubes that carry sperm (vasectomy). After the procedure, the man can still ejaculate fluid (semen).  Summary  Contraception, also called birth control, means methods or devices that prevent pregnancy.  Hormonal methods of contraception include implants, injections, pills, patches, vaginal rings, and emergency contraceptives.  Barrier methods of contraception can include female condoms, female condoms, diaphragms, cervical caps, sponges, and spermicides.  There are two types of IUDs (intrauterine devices). An IUD can be put in a woman's uterus to prevent pregnancy for 3-5 years.  Permanent sterilization can be done through a procedure for males, females, or both. This information is not intended to replace advice given to you   by your health care provider. Make sure you discuss any questions you have with your  health care provider. Document Released: 03/09/2005 Document Revised: 04/11/2016 Document Reviewed: 04/11/2016 Elsevier Interactive Patient Education  2018 Elsevier Inc.  

## 2019-03-14 NOTE — Progress Notes (Signed)
Pt informed that the ultrasound is considered a limited OB ultrasound and is not intended to be a complete ultrasound exam.  Patient also informed that the ultrasound is not being completed with the intent of assessing for fetal or placental anomalies or any pelvic abnormalities.  Explained that the purpose of today's ultrasound is to assess for  presentation.  Patient acknowledges the purpose of the exam and the limitations of the study.    Cephalic  Brenda Szeliga, NP   

## 2019-03-15 ENCOUNTER — Encounter (HOSPITAL_COMMUNITY): Payer: Self-pay | Admitting: Obstetrics and Gynecology

## 2019-03-15 ENCOUNTER — Inpatient Hospital Stay (HOSPITAL_COMMUNITY): Payer: Medicaid Other | Admitting: Anesthesiology

## 2019-03-15 DIAGNOSIS — Z3A40 40 weeks gestation of pregnancy: Secondary | ICD-10-CM

## 2019-03-15 DIAGNOSIS — O480 Post-term pregnancy: Secondary | ICD-10-CM

## 2019-03-15 DIAGNOSIS — O368130 Decreased fetal movements, third trimester, not applicable or unspecified: Secondary | ICD-10-CM

## 2019-03-15 LAB — RPR: RPR Ser Ql: NONREACTIVE

## 2019-03-15 MED ORDER — SIMETHICONE 80 MG PO CHEW: 80.0000 mg | CHEWABLE_TABLET | ORAL | Status: DC | PRN

## 2019-03-15 MED ORDER — PRENATAL MULTIVITAMIN CH
1.0000 | ORAL_TABLET | Freq: Every day | ORAL | Status: DC
  Administered 2019-03-15 – 2019-03-16 (×2): 1 via ORAL
  Filled 2019-03-15 (×2): qty 1

## 2019-03-15 MED ORDER — LACTATED RINGERS IV SOLN: 500.0000 mL | Freq: Once | INTRAVENOUS | Status: DC

## 2019-03-15 MED ORDER — DIPHENHYDRAMINE HCL 25 MG PO CAPS: 25.0000 mg | ORAL_CAPSULE | Freq: Four times a day (QID) | ORAL | Status: DC | PRN

## 2019-03-15 MED ORDER — SODIUM CHLORIDE (PF) 0.9 % IJ SOLN
INTRAMUSCULAR | Status: DC | PRN
  Administered 2019-03-15: 12 mL/h via EPIDURAL

## 2019-03-15 MED ORDER — LIDOCAINE-EPINEPHRINE (PF) 2 %-1:200000 IJ SOLN
INTRAMUSCULAR | Status: DC | PRN
  Administered 2019-03-15 (×2): 2 mL via EPIDURAL

## 2019-03-15 MED ORDER — PHENYLEPHRINE 40 MCG/ML (10ML) SYRINGE FOR IV PUSH (FOR BLOOD PRESSURE SUPPORT): 80.0000 ug | PREFILLED_SYRINGE | INTRAVENOUS | Status: DC | PRN

## 2019-03-15 MED ORDER — ZOLPIDEM TARTRATE 5 MG PO TABS: 5.0000 mg | ORAL_TABLET | Freq: Every evening | ORAL | Status: DC | PRN

## 2019-03-15 MED ORDER — FENTANYL-BUPIVACAINE-NACL 0.5-0.125-0.9 MG/250ML-% EP SOLN
12.0000 mL/h | EPIDURAL | Status: DC | PRN
  Filled 2019-03-15: qty 250

## 2019-03-15 MED ORDER — BENZOCAINE-MENTHOL 20-0.5 % EX AERO: 1.0000 "application " | INHALATION_SPRAY | CUTANEOUS | Status: DC | PRN

## 2019-03-15 MED ORDER — COCONUT OIL OIL: 1.0000 "application " | TOPICAL_OIL | Status: DC | PRN

## 2019-03-15 MED ORDER — ONDANSETRON HCL 4 MG PO TABS: 4.0000 mg | ORAL_TABLET | ORAL | Status: DC | PRN

## 2019-03-15 MED ORDER — ONDANSETRON HCL 4 MG/2ML IJ SOLN: 4.0000 mg | INTRAMUSCULAR | Status: DC | PRN

## 2019-03-15 MED ORDER — FENTANYL CITRATE (PF) 100 MCG/2ML IJ SOLN
100.0000 ug | INTRAMUSCULAR | Status: DC | PRN
  Administered 2019-03-15: 100 ug via INTRAVENOUS
  Filled 2019-03-15: qty 2

## 2019-03-15 MED ORDER — DIBUCAINE (PERIANAL) 1 % EX OINT: 1.0000 "application " | TOPICAL_OINTMENT | CUTANEOUS | Status: DC | PRN

## 2019-03-15 MED ORDER — EPHEDRINE 5 MG/ML INJ: 10.0000 mg | INTRAVENOUS | Status: DC | PRN

## 2019-03-15 MED ORDER — TETANUS-DIPHTH-ACELL PERTUSSIS 5-2.5-18.5 LF-MCG/0.5 IM SUSP: 0.5000 mL | Freq: Once | INTRAMUSCULAR | Status: DC

## 2019-03-15 MED ORDER — DIPHENHYDRAMINE HCL 50 MG/ML IJ SOLN: 12.5000 mg | INTRAMUSCULAR | Status: DC | PRN

## 2019-03-15 MED ORDER — WITCH HAZEL-GLYCERIN EX PADS: 1.0000 "application " | MEDICATED_PAD | CUTANEOUS | Status: DC | PRN

## 2019-03-15 MED ORDER — IBUPROFEN 600 MG PO TABS
600.0000 mg | ORAL_TABLET | Freq: Four times a day (QID) | ORAL | Status: DC
  Administered 2019-03-15 – 2019-03-16 (×4): 600 mg via ORAL
  Filled 2019-03-15 (×4): qty 1

## 2019-03-15 MED ORDER — ACETAMINOPHEN 325 MG PO TABS: 650.0000 mg | ORAL_TABLET | ORAL | Status: DC | PRN

## 2019-03-15 MED ORDER — PRENATE MINI 18-0.6-0.4-350 MG PO CAPS: 1.0000 | ORAL_CAPSULE | Freq: Every day | ORAL | Status: DC

## 2019-03-15 MED ORDER — SENNOSIDES-DOCUSATE SODIUM 8.6-50 MG PO TABS
2.0000 | ORAL_TABLET | ORAL | Status: DC
  Administered 2019-03-15 – 2019-03-16 (×2): 2 via ORAL
  Filled 2019-03-15 (×2): qty 2

## 2019-03-15 NOTE — Anesthesia Procedure Notes (Signed)
Epidural Patient location during procedure: OB Start time: 03/15/2019 3:25 AM End time: 03/15/2019 3:40 AM  Staffing Anesthesiologist: Freddrick March, MD Performed: anesthesiologist   Preanesthetic Checklist Completed: patient identified, IV checked, risks and benefits discussed, monitors and equipment checked, pre-op evaluation and timeout performed  Epidural Patient position: sitting Prep: DuraPrep and site prepped and draped Patient monitoring: continuous pulse ox, blood pressure, heart rate and cardiac monitor Approach: midline Location: L3-L4 Injection technique: LOR air  Needle:  Needle type: Tuohy  Needle gauge: 17 G Needle length: 9 cm Needle insertion depth: 4 cm Catheter type: closed end flexible Catheter size: 19 Gauge Catheter at skin depth: 10 cm Test dose: negative  Assessment Sensory level: T8 Events: blood not aspirated, injection not painful, no injection resistance, no paresthesia and negative IV test  Additional Notes Patient identified. Risks/Benefits/Options discussed with patient including but not limited to bleeding, infection, nerve damage, paralysis, failed block, incomplete pain control, headache, blood pressure changes, nausea, vomiting, reactions to medication both or allergic, itching and postpartum back pain. Confirmed with bedside nurse the patient's most recent platelet count. Confirmed with patient that they are not currently taking any anticoagulation, have any bleeding history or any family history of bleeding disorders. Patient expressed understanding and wished to proceed. All questions were answered. Sterile technique was used throughout the entire procedure. Please see nursing notes for vital signs. Test dose was given through epidural catheter and negative prior to continuing to dose epidural or start infusion. Warning signs of high block given to the patient including shortness of breath, tingling/numbness in hands, complete motor block,  or any concerning symptoms with instructions to call for help. Patient was given instructions on fall risk and not to get out of bed. All questions and concerns addressed with instructions to call with any issues or inadequate analgesia.  Reason for block:procedure for pain

## 2019-03-15 NOTE — Discharge Summary (Addendum)
Postpartum Discharge Summary      Patient Name: Brenda Harvey DOB: July 13, 1995 MRN: [id number]  Date of admission: 03/14/2019 Delivering Provider: Caren Macadam   Date of discharge: 03/17/2019  Admitting diagnosis: Post-dates pregnancy [O48.0] Intrauterine pregnancy: [redacted]w[redacted]d    Secondary diagnosis:  Active Problems:   Post-dates pregnancy   Vacuum-assisted vaginal delivery   Second-degree perineal laceration during delivery  Additional problems: None     Discharge diagnosis: Term Pregnancy Delivered                                                                                                Post partum procedures:none  Augmentation:  None-PDIOL  Complications: None  Hospital course:  Induction of Labor With Vaginal Delivery   23y.o. yo G2P1011 at 434w2das admitted to the hospital 03/14/2019 for induction of labor.  Indication for induction: Postdates.  She was given cytotec and progressed to complete dilation.  She started pushing and fetal bradycardia noted.  Dr. NeErnestina Patcheso bedside and vacuum applied with successful delivery.  Patient had an uncomplicated labor course as follows: Membrane Rupture Time/Date: 7:25 AM ,03/15/2019   Intrapartum Procedures: Episiotomy: None [1]                                         Lacerations:  2nd degree [3];Perineal [11]  Patient had delivery of a Viable infant.  Information for the patient's newborn:  [name], [name] [[id number]]Delivery Method: Vag-Spont    03/15/2019  Details of delivery can be found in separate delivery note.  Patient had a routine postpartum course. Patient is discharged home 03/17/19. Delivery time: 9:22 AM    Magnesium Sulfate received: No BMZ received: No Rhophylac:N/A MMR:N/A Transfusion:No  Physical exam  Vitals:   03/16/19 0030 03/16/19 0508 03/16/19 2138 03/17/19 0508  BP: 121/79 111/79 108/75 131/87  Pulse: 63 66 70 68  Resp: _0 Temp: 99.2 F (37.3 C) 98.9 F  (37.2 C) 97.6 F (36.4 C) 98.8 F (37.1 C)  TempSrc: Oral Oral Oral Oral  SpO2: 100% 100% 100%   Weight:      Height:       General: alert, cooperative and no distress Lochia: appropriate Uterine Fundus: firm Incision: N/A DVT Evaluation: No evidence of DVT seen on physical exam. Labs: Lab Results  Component Value Date   WBC 13.3 (H) 03/16/2019   HGB 8.8 (L) 03/16/2019   HCT 26.6 (L) 03/16/2019   MCV 86.4 03/16/2019   PLT 216 03/16/2019   No flowsheet data found.  Discharge instruction: per After Visit Summary and "Baby and Me Booklet".  After visit meds:  Allergies as of 03/17/2019   No Known Allergies     Medication List    STOP taking these medications   [place] medications   acetaminophen 325 MG tablet Commonly known as: Tylenol Take 2 tablets (650 mg total) by mouth every 4 (four)  hours as needed (for pain scale < 4).   albuterol 108 (90 Base) MCG/ACT inhaler Commonly known as: VENTOLIN HFA Inhale 2 puffs into the lungs every 6 (six) hours as needed for wheezing or shortness of breath.   Blood Pressure Kit Devi 1 kit by Does not apply route once a week. Check BP Weekly. Large Cuff.  DX: Z13.6  Z34.86  O09.0   ibuprofen 100 MG/5ML suspension Commonly known as: ADVIL Take 30 mLs (600 mg total) by mouth every 6 (six) hours.   Prenate Mini 18-0.6-0.4-350 MG Caps Take 1 capsule by mouth daily.       Diet: routine diet  Activity: Advance as tolerated. Pelvic rest for 6 weeks.   Outpatient follow up:4 weeks Follow up Appt: Future Appointments  Date Time Provider [place]  04/13/2019  2:30 PM Gavin Pound, CNM [facility] None   Follow up Visit: [place] Follow up in 6 day(s).   Contact information: [place] [id number] [phone]        Message Sent   Please schedule this patient for Postpartum visit in:  4 weeks with the following provider: Any provider For C/S patients schedule nurse incision check in weeks 2 weeks: no Low risk pregnancy complicated by:NA Delivery mode:  Vacuum Anticipated Birth Control:  other/unsure. Discussed LARCs as most effective on 03/16/19. Pt considering Nexplanon but unsure.  PP Procedures needed:  None   Schedule Integrated BH visit: no      Newborn Data: Live born female  Birth Weight: 7 lb 2.5 oz (3245 g) APGAR: 8, 9  Newborn Delivery   Birth date/time: 03/15/2019 09:22:00 Delivery type: Vaginal, Vacuum (Extractor)      Baby Feeding: Breast Disposition:home with mother   03/17/2019 Merilyn Baba, DO

## 2019-03-15 NOTE — Anesthesia Postprocedure Evaluation (Signed)
Anesthesia Post Note  Patient: Brenda Harvey  Procedure(s) Performed: AN AD HOC LABOR EPIDURAL     Patient location during evaluation: Mother Baby Anesthesia Type: Epidural Level of consciousness: awake and alert Pain management: pain level controlled Vital Signs Assessment: post-procedure vital signs reviewed and stable Respiratory status: spontaneous breathing, nonlabored ventilation and respiratory function stable Cardiovascular status: stable Postop Assessment: no headache, no backache and epidural receding Anesthetic complications: no    Last Vitals:  Vitals:   03/15/19 1130 03/15/19 1235  BP: 133/80 129/87  Pulse: 67 64  Resp: 16 18  Temp: 37.1 C 37 C  SpO2: 100% 100%    Last Pain:  Vitals:   03/15/19 1235  TempSrc: Oral  PainSc: 2    Pain Goal: Patients Stated Pain Goal: 2 (03/15/19 1235)                 Tariq Pernell

## 2019-03-15 NOTE — Anesthesia Preprocedure Evaluation (Signed)
Anesthesia Evaluation  Patient identified by MRN, date of birth, ID band Patient awake    Reviewed: Allergy & Precautions, NPO status , Patient's Chart, lab work & pertinent test results  Airway Mallampati: II  TM Distance: >3 FB Neck ROM: Full    Dental no notable dental hx.    Pulmonary neg pulmonary ROS,  Covid positive 01/2019, asymptomatic   Pulmonary exam normal breath sounds clear to auscultation       Cardiovascular negative cardio ROS Normal cardiovascular exam Rhythm:Regular Rate:Normal     Neuro/Psych negative neurological ROS  negative psych ROS   GI/Hepatic negative GI ROS, Neg liver ROS,   Endo/Other  negative endocrine ROS  Renal/GU negative Renal ROS  negative genitourinary   Musculoskeletal negative musculoskeletal ROS (+)   Abdominal   Peds  Hematology negative hematology ROS (+)   Anesthesia Other Findings   Reproductive/Obstetrics (+) Pregnancy                            Anesthesia Physical Anesthesia Plan  ASA: II  Anesthesia Plan: Epidural   Post-op Pain Management:    Induction:   PONV Risk Score and Plan: Treatment may vary due to age or medical condition  Airway Management Planned: Natural Airway  Additional Equipment:   Intra-op Plan:   Post-operative Plan:   Informed Consent: I have reviewed the patients History and Physical, chart, labs and discussed the procedure including the risks, benefits and alternatives for the proposed anesthesia with the patient or authorized representative who has indicated his/her understanding and acceptance.       Plan Discussed with: Anesthesiologist  Anesthesia Plan Comments: (Patient identified. Risks, benefits, options discussed with patient including but not limited to bleeding, infection, nerve damage, paralysis, failed block, incomplete pain control, headache, blood pressure changes, nausea, vomiting,  reactions to medication, itching, and post partum back pain. Confirmed with bedside nurse the patient's most recent platelet count. Confirmed with the patient that they are not taking any anticoagulation, have any bleeding history or any family history of bleeding disorders. Patient expressed understanding and wishes to proceed. All questions were answered. )        Anesthesia Quick Evaluation

## 2019-03-15 NOTE — Progress Notes (Signed)
Vitals:   03/15/19 0530 03/15/19 0600  BP: (!) 96/52 (!) 100/59  Pulse: 79 78  Resp:    Temp:    SpO2: 100% 98%   Comfortable w/epidural  FHR Cat 1.  Ctx regular, q 2-3 minutes.  Cx has continued to change after 2nd cytotec, so no further augmentation at this time.  Cx 5/100/-1 per RN.  Continue present mgt>AROM after rounds.

## 2019-03-15 NOTE — Lactation Note (Signed)
This note was copied from a baby's chart. Lactation Consultation Note  Patient Name: Brenda Harvey Today's Date: 03/15/2019 Reason for consult: Initial assessment  Initial visit at 5 hours of life. Mom is a P1. Mom had considerable breast changes w/pregnancy.   Infant fed soon after birth & has not fed since. Currently, infant is sleeping skin-to-skin on Mom's chest. Hand expression was taught to Mom & was able to express 2-3 mL with ease. I explained to parents how they could use a clean finger to entice/finger-feed infant later, if infant is still not cueing to eat. RN made aware.  Mom was made aware of O/P services and our phone # for post-discharge questions.   Matthias Hughs [facility] 03/15/2019, 3:05 PM

## 2019-03-16 LAB — CBC
HCT: 26.6 % — ABNORMAL LOW (ref 36.0–46.0)
Hemoglobin: 8.8 g/dL — ABNORMAL LOW (ref 12.0–15.0)
MCH: 28.6 pg (ref 26.0–34.0)
MCHC: 33.1 g/dL (ref 30.0–36.0)
MCV: 86.4 fL (ref 80.0–100.0)
Platelets: 216 10*3/uL (ref 150–400)
RBC: 3.08 MIL/uL — ABNORMAL LOW (ref 3.87–5.11)
RDW: 13.2 % (ref 11.5–15.5)
WBC: 13.3 10*3/uL — ABNORMAL HIGH (ref 4.0–10.5)
nRBC: 0 % (ref 0.0–0.2)

## 2019-03-16 MED ORDER — IBUPROFEN 100 MG/5ML PO SUSP: 600.0000 mg | Freq: Four times a day (QID) | ORAL | 0 refills | Status: DC

## 2019-03-16 MED ORDER — IBUPROFEN 100 MG/5ML PO SUSP
600.0000 mg | Freq: Four times a day (QID) | ORAL | Status: DC
  Administered 2019-03-16 – 2019-03-17 (×4): 600 mg via ORAL
  Filled 2019-03-16 (×4): qty 30

## 2019-03-16 NOTE — Lactation Note (Signed)
This note was copied from a baby's chart. Lactation Consultation Note  Patient Name: Brenda Harvey [id number] Date: 03/16/2019 Reason for consult: Follow-up assessment   Baby 46 hours old and mother wants to go home early so [place] worked with family. Observed feeding on both breasts.  Mother is confident on L breast and baby latches easily on that side in football hold.  R side baby is still working on sustaining latch.  R nipple is slightly shorter.  Baby is tongue thrusting so she gets on and slips off on R side and has to be repositioned.  Parents aware of "smacking" sound when baby slips down. Encouraged mother to bf in cross cradle with U shaped support on R side to achieve a deep latch.  Surrounded mother with pillows for support.  Provided mother with manual pump to prepump prior to latching. Encouraged mother to continue to hand express (she has good flow) prior to latching. Reviewed bf frequency and voids/stools. Mother knows if she has difficulty sustaining latch on L side, she will pump for 10 min and give volume back to baby. Referred parents to Baby booklet for further information including milk storage. Feed on demand with cues.  Goal 8-12+ times per day after first 24 hrs.  Place baby STS if not cueing.  Reviewed engorgement care and monitoring voids/stools.    Maternal Data    Feeding Feeding Type: Breast Fed  LATCH Score Latch: Grasps breast easily, tongue down, lips flanged, rhythmical sucking.(easy latch on L side, working on R side)  Audible Swallowing: A few with stimulation  Type of Nipple: Everted at rest and after stimulation  Comfort (Breast/Nipple): Soft / non-tender  Hold (Positioning): Assistance needed to correctly position infant at breast and maintain latch.  LATCH Score: 8  Interventions Interventions: Breast feeding basics reviewed;Assisted with latch;Hand express;Pre-pump if needed;Breast compression;Adjust position;Support pillows;Position  options;Hand pump  Lactation Tools Discussed/Used     Consult Status Consult Status: Follow-up Date: 03/17/19 Follow-up type: In-patient    Brenda Harvey [facility] 03/16/2019, 9:18 AM

## 2019-03-16 NOTE — Progress Notes (Signed)
MD present at bedside for insertion of Nexplanon inti the left arm. INformation about nexplanon explained to pt with no further questionms. Pt tolerated procedure well.

## 2019-03-17 MED ORDER — ACETAMINOPHEN 325 MG PO TABS: 650.0000 mg | ORAL_TABLET | ORAL | 0 refills | Status: DC | PRN

## 2019-03-17 NOTE — Lactation Note (Signed)
This note was copied from a baby's chart. Lactation Consultation Note  Patient Name: Brenda Harvey [id number] Date: 03/17/2019 Reason for consult: Follow-up assessment  P1 mother whose infant is now 68 hours old.  Mother had a couple of questions regarding latching.  Offered to observe her latch and mother agreeable.  Mother had baby held in the cross cradle position and was feeding prior to my arrival.  Asked mother to show me a latch while I observed.  She provided good breast support and latched baby to the left breast in the cross cradle hold.  Encouraged her to latch a little bit deeper which she was able to do.  Breast compressions discussed and mother continued to feed baby.  Educated her on how to know if baby latches well:  He had a wide gape and flanged lips and mother denied pain.  Showed father how to do the chin tug if he needs to assist mother at home.  Observed him feeding for 10 minutes.  Mother is a [facility] participant and would like to breast/bottle feed.  She desires the formula package from the [facility] department.  She does not have a DEBP for home use but her mother is planning on purchasing a DEBP for her.    Engorgement prevention/treatment discussed.  Mother has a manual pump for home use.  She has our OP phone number for questions/concerns after discharge.  Father present.   Maternal Data    Feeding Feeding Type: Breast Fed  LATCH Score Latch: Grasps breast easily, tongue down, lips flanged, rhythmical sucking.  Audible Swallowing: A few with stimulation  Type of Nipple: Everted at rest and after stimulation  Comfort (Breast/Nipple): Soft / non-tender  Hold (Positioning): Assistance needed to correctly position infant at breast and maintain latch.  LATCH Score: 8  Interventions    Lactation Tools Discussed/Used     Consult Status Consult Status: Complete Date: 03/17/19 Follow-up type: Call as needed    Lanice Schwab Tyshay Adee 03/17/2019, 10:47 AM

## 2019-03-17 NOTE — Discharge Instructions (Signed)

## 2019-03-18 LAB — TYPE AND SCREEN
ABO/RH(D): AB POS
Antibody Screen: NEGATIVE
Unit division: 0
Unit division: 0

## 2019-03-18 LAB — BPAM RBC
Blood Product Expiration Date: 202012302359
Blood Product Expiration Date: 202101252359
Unit Type and Rh: 6200
Unit Type and Rh: 6200

## 2019-03-20 ENCOUNTER — Inpatient Hospital Stay (HOSPITAL_COMMUNITY)
Admission: AD | Admit: 2019-03-20 | Payer: Medicaid Other | Source: Home / Self Care | Admitting: Obstetrics & Gynecology

## 2019-03-20 ENCOUNTER — Inpatient Hospital Stay (HOSPITAL_COMMUNITY): Payer: Medicaid Other

## 2019-04-13 ENCOUNTER — Other Ambulatory Visit: Payer: Self-pay

## 2019-04-13 ENCOUNTER — Ambulatory Visit (INDEPENDENT_AMBULATORY_CARE_PROVIDER_SITE_OTHER): Payer: Medicaid Other

## 2019-04-13 ENCOUNTER — Other Ambulatory Visit (HOSPITAL_COMMUNITY)
Admission: RE | Admit: 2019-04-13 | Discharge: 2019-04-13 | Disposition: A | Discharge status: Home / Self Care | Payer: Medicaid Other | Source: Ambulatory Visit

## 2019-04-13 DIAGNOSIS — Z124 Encounter for screening for malignant neoplasm of cervix: Secondary | ICD-10-CM

## 2019-04-13 NOTE — Patient Instructions (Signed)
Contraception Choices Contraception, also called birth control, refers to methods or devices that prevent pregnancy. Hormonal methods Contraceptive implant  A contraceptive implant is a thin, plastic tube that contains a hormone. It is inserted into the upper part of the arm. It can remain in place for up to 3 years. Progestin-only injections Progestin-only injections are injections of progestin, a synthetic form of the hormone progesterone. They are given every 3 months by a health care provider. Birth control pills  Birth control pills are pills that contain hormones that prevent pregnancy. They must be taken once a day, preferably at the same time each day. Birth control patch  The birth control patch contains hormones that prevent pregnancy. It is placed on the skin and must be changed once a week for three weeks and removed on the fourth week. A prescription is needed to use this method of contraception. Vaginal ring  A vaginal ring contains hormones that prevent pregnancy. It is placed in the vagina for three weeks and removed on the fourth week. After that, the process is repeated with a new ring. A prescription is needed to use this method of contraception. Emergency contraceptive Emergency contraceptives prevent pregnancy after unprotected sex. They come in pill form and can be taken up to 5 days after sex. They work best the sooner they are taken after having sex. Most emergency contraceptives are available without a prescription. This method should not be used as your only form of birth control. Barrier methods Female condom  A female condom is a thin sheath that is worn over the penis during sex. Condoms keep sperm from going inside a woman's body. They can be used with a spermicide to increase their effectiveness. They should be disposed after a single use. Female condom  A female condom is a soft, loose-fitting sheath that is put into the vagina before sex. The condom keeps sperm  from going inside a woman's body. They should be disposed after a single use. Diaphragm  A diaphragm is a soft, dome-shaped barrier. It is inserted into the vagina before sex, along with a spermicide. The diaphragm blocks sperm from entering the uterus, and the spermicide kills sperm. A diaphragm should be left in the vagina for 6-8 hours after sex and removed within 24 hours. A diaphragm is prescribed and fitted by a health care provider. A diaphragm should be replaced every 1-2 years, after giving birth, after gaining more than 15 lb (6.8 kg), and after pelvic surgery. Cervical cap  A cervical cap is a round, soft latex or plastic cup that fits over the cervix. It is inserted into the vagina before sex, along with spermicide. It blocks sperm from entering the uterus. The cap should be left in place for 6-8 hours after sex and removed within 48 hours. A cervical cap must be prescribed and fitted by a health care provider. It should be replaced every 2 years. Sponge  A sponge is a soft, circular piece of polyurethane foam with spermicide on it. The sponge helps block sperm from entering the uterus, and the spermicide kills sperm. To use it, you make it wet and then insert it into the vagina. It should be inserted before sex, left in for at least 6 hours after sex, and removed and thrown away within 30 hours. Spermicides Spermicides are chemicals that kill or block sperm from entering the cervix and uterus. They can come as a cream, jelly, suppository, foam, or tablet. A spermicide should be inserted into the   vagina with an applicator at least 10-15 minutes before sex to allow time for it to work. The process must be repeated every time you have sex. Spermicides do not require a prescription. Intrauterine contraception Intrauterine device (IUD) An IUD is a T-shaped device that is put in a woman's uterus. There are two types:  Hormone IUD.This type contains progestin, a synthetic form of the hormone  progesterone. This type can stay in place for 3-5 years.  Copper IUD.This type is wrapped in copper wire. It can stay in place for 10 years.  Permanent methods of contraception Female tubal ligation In this method, a woman's fallopian tubes are sealed, tied, or blocked during surgery to prevent eggs from traveling to the uterus. Hysteroscopic sterilization In this method, a small, flexible insert is placed into each fallopian tube. The inserts cause scar tissue to form in the fallopian tubes and block them, so sperm cannot reach an egg. The procedure takes about 3 months to be effective. Another form of birth control must be used during those 3 months. Female sterilization This is a procedure to tie off the tubes that carry sperm (vasectomy). After the procedure, the man can still ejaculate fluid (semen). Natural planning methods Natural family planning In this method, a couple does not have sex on days when the woman could become pregnant. Calendar method This means keeping track of the length of each menstrual cycle, identifying the days when pregnancy can happen, and not having sex on those days. Ovulation method In this method, a couple avoids sex during ovulation. Symptothermal method This method involves not having sex during ovulation. The woman typically checks for ovulation by watching changes in her temperature and in the consistency of cervical mucus. Post-ovulation method In this method, a couple waits to have sex until after ovulation. Summary  Contraception, also called birth control, means methods or devices that prevent pregnancy.  Hormonal methods of contraception include implants, injections, pills, patches, vaginal rings, and emergency contraceptives.  Barrier methods of contraception can include female condoms, female condoms, diaphragms, cervical caps, sponges, and spermicides.  There are two types of IUDs (intrauterine devices). An IUD can be put in a woman's uterus to  prevent pregnancy for 3-5 years.  Permanent sterilization can be done through a procedure for males, females, or both.  Natural family planning methods involve not having sex on days when the woman could become pregnant. This information is not intended to replace advice given to you by your health care provider. Make sure you discuss any questions you have with your health care provider. Document Revised: 03/11/2017 Document Reviewed: 04/11/2016 Elsevier Patient Education  2020 [employer]. How to Use a Condom Correctly Using a condom correctly and consistently is important for preventing pregnancy and the spread of sexually transmitted diseases (STDs). Condoms work by blocking contact with bodily fluids that can result in pregnancy or spread infection. This is called the barrier method. What do condoms prevent? Condoms can effectively prevent:  Pregnancy.  STDs that are transmitted through genital fluids. These include: ? HIV and AIDS. ? Gonorrhea. ? Chlamydia. ? Hepatitis B and C. Condoms also offer some protection from STDs that are transmitted through skin-to-skin contact if the infected area is covered by the condom. These infections include:  Syphilis.  Genital herpes.  Human papillomavirus (HPV).  Trichomoniasis. What are the different types of condoms? There are both female and female condoms. A female condom is a thin sheath that fits over an erect penis. Female condoms can  be made from different materials, including:  Latex.  Polyurethane. This is a type of plastic.  Synthetic rubber.  Lambskin or other natural membranes. These condoms are not as effective at preventing STDs because some germs can pass through them. Female condoms can be lubricated or unlubricated. A female condom is a thin pouch inserted into the vagina. An inner ring holds the condom in place. Another ring covers the outer folds of skin (labia). Female condoms are made from a rubber-like substance  (nitrile). Female condoms are lubricated. What are the risks? Generally, using a condom is safe. However, problems may occur, such as:  Allergic reaction to the material that the condom is made from.  A condom tearing when being used. Proper use and storage can help lower this risk. General recommendations  Store condoms in a cool, dry place.  Before using a condom: ? Check the package to make sure the expiration date has not passed. ? Make sure that both the package and the condom do not have any holes, rips, or tears in them. ? Make sure that the condom is not brittle or discolored.  Do not use the same condom more than once. Use a new condom every time you have sex.  Do not use oil-based lubricants with condoms. You can use water-based or silicone-based lubricants with female or female condoms. How to use a female condom 1. Make sure the condom is ready to be put on the right way. It should be ready to unroll downward. 2. Place the condom over your erect penis before engaging in any contact with your partner's mouth, anus, or vagina. 3. Pinch the tip of the condom while rolling it down to the base of the penis so that the entire penis is covered. 4. After you ejaculate, hold the rim of the condom as you withdraw. 5. Carefully pull off the condom away from your partner, and be careful to avoid spills. 6. Wrap the condom in tissue or toilet paper and discard it in a trash can. Do not flush it down the toilet. How to use a female condom 1. Place the condom inside your vagina before engaging in any contact with your partner's penis. To do this: ? Squeeze the inner ring and insert it into the vagina like a tampon. ? Use your index finger to push it into place. There should be about one inch of condom outside of the vagina to expand during sex. ? Make sure the outer part of the condom completely covers the labia. 2. Have your partner withdraw his penis shortly after ejaculating. 3. Before  you stand up, grasp the condom. Twist the outer part slightly to hold in fluid and carefully remove it. Your partner can also grasp the condom and remove it at the same time he withdraws his penis. 4. Wrap the condom in tissue or toilet paper and discard it in a trash can. Do not flush it down the toilet. Where to find more information Learn more about how to use a condom correctly from:  Centers for Disease Control and Prevention: http://www.wolf.info/  http://www.dixon-collier.info/: CouchLoc[employer].co.nz  Planned Parenthood: www.plannedparenthood.org Summary  Using a condom correctly and consistently is important for preventing pregnancy and the spread of sexually transmitted diseases (STDs).  Condoms work by blocking contact with bodily fluids that can result in pregnancy or spread infection.  Store condoms in a cool, dry place. Before using a condom, check the expiration date, and make sure the condom has no holes in it  and is not brittle or discolored. This information is not intended to replace advice given to you by your health care provider. Make sure you discuss any questions you have with your health care provider. Document Revised: 10/11/2018 Document Reviewed: 10/11/2018 Elsevier Patient Education  2020 [employer].

## 2019-04-13 NOTE — Progress Notes (Signed)
Subjective:     Brenda Harvey is a 24 y.o. female who presents for a postpartum visit. She is 4 weeks postpartum following a spontaneous vaginal delivery. I have fully reviewed the prenatal and intrapartum course. The delivery was at 40.2 gestational weeks. Outcome: vacuum, outlet. Anesthesia: epidural. Postpartum course has been Unremarkable. Baby's course has been Unremarkable. Baby is feeding by breast. Bleeding no bleeding. Bowel function is normal. Bladder function is normal. Patient is not sexually active. Contraception method is none. Postpartum depression screening: negative = 0.  Patient reports that all of her family lives in [place], [place], but that she receives good support from her BF who is the FOB.  She denies DVA and endorses feeling safe at home. Patient states that she has no current interest in sexual activity and declines birth control.  Patient states she used condoms in the past.  Patient also reports that she is scheduled to return to [facility] on March 1st, but feels she needs more time.   The following portions of the patient's history were reviewed and updated as appropriate: allergies, current medications, past family history, past medical history, past social history, past surgical history and problem list.  Review of Systems Pertinent items are noted in HPI.   Objective:    BP 122/75   Pulse 84   Ht 5\' 1"  (1.549 m)   Wt 115 lb (52.2 kg)   LMP 06/06/2018   Breastfeeding Yes   BMI 21.73 kg/m   General:  alert, cooperative and no distress   Breasts:  Not Examined  Lungs: clear to auscultation bilaterally  Heart:  regular rate and rhythm  Abdomen: soft, non-tender; bowel sounds normal; no masses,  no organomegaly   Vulva:  normal  Vagina: normal vagina and Healing well.   Cervix:  no lesions and Os is somewhat stenotic and bleeds after Pap collection.  Corpus: normal size, contour, position, consistency, mobility, non-tender  Adnexa:  not evaluated  Rectal Exam: Not  performed.        Assessment:    24 year old G2P1011  4 week Postpartum exam Breastfeeding Normal Involution 2nd Degree Laceration Pap smear done at today's visit.   Plan:   1. Postpartum state -Okay to return to normal activities including work and sex. -Informed that her lack of desire for sexual activity will pass as she evolves into her new role. -Discussed usage of lubrication with sexual activity.  -Encouraged to call if contraception desired in future.  2. Screening for cervical cancer Pap collected today.  3. Follow up in: 1 year or as needed.

## 2019-04-18 LAB — CYTOLOGY - PAP: Diagnosis: REACTIVE

## 2019-07-12 ENCOUNTER — Ambulatory Visit: Payer: Medicaid Other

## 2019-12-19 ENCOUNTER — Ambulatory Visit (INDEPENDENT_AMBULATORY_CARE_PROVIDER_SITE_OTHER): Payer: Medicaid Other

## 2019-12-19 ENCOUNTER — Other Ambulatory Visit: Payer: Self-pay

## 2019-12-19 VITALS — BP 109/72 | HR 71 | Wt 116.0 lb

## 2019-12-19 DIAGNOSIS — Z3480 Encounter for supervision of other normal pregnancy, unspecified trimester: Secondary | ICD-10-CM | POA: Insufficient documentation

## 2019-12-19 DIAGNOSIS — Z3201 Encounter for pregnancy test, result positive: Secondary | ICD-10-CM

## 2019-12-19 LAB — POCT URINE PREGNANCY: Preg Test, Ur: POSITIVE — AB

## 2019-12-19 NOTE — Progress Notes (Signed)
I have reviewed the chart and agree with the nurse's note and plan of care for this visit.   Sharen Counter, CNM 5:34 PM

## 2019-12-19 NOTE — Progress Notes (Signed)
Ms. Genao presents today for UPT. She has no unusual complaints.  LMP:11/10/2019 EDD: 08/16/2020 [place]      [redacted]w[redacted]d    OBJECTIVE: Appears well, in no apparent distress.  OB History    Gravida  3   Para  1   Term  1   Preterm      AB  1   Living  1     SAB  1   TAB      Ectopic      Multiple  0   Live Births  1          Home UPT Result: POSITIVE In-Office UPT result: POSITIVE  I have reviewed the patient's medical, obstetrical, social, and family histories, and medications.   ASSESSMENT: Positive pregnancy test  PLAN Prenatal care to be completed at: [facility]  --------------------------------------------------------  PRENATAL INTAKE SUMMARY  Ms. Pincock presents today New OB Nurse Interview.  OB History    Gravida  3   Para  1   Term  1   Preterm      AB  1   Living  1     SAB  1   TAB      Ectopic      Multiple  0   Live Births  1          I have reviewed the patient's medical, obstetrical, social, and family histories, medications, and available lab results.  SUBJECTIVE She has no unusual complaints  OBJECTIVE Initial Nurse Intake (New OB)  GENERAL APPEARANCE: alert, well appearing   ASSESSMENT Normal pregnancy PHQ-9=0 Already have BP Cuff  PLAN Prenatal care at [facility] Pnl/HIV will be done at [facility] visit Babyscripts downloaded

## 2020-01-30 ENCOUNTER — Encounter: Payer: Medicaid Other | Admitting: Nurse Practitioner

## 2020-02-06 ENCOUNTER — Encounter: Payer: Medicaid Other | Admitting: Advanced Practice Midwife

## 2020-02-26 ENCOUNTER — Ambulatory Visit: Payer: Medicaid Other | Admitting: Internal Medicine

## 2020-11-10 IMAGING — US US MFM OB COMP +14 WKS
1 series · 13 of 28 positions shown · non-contrast
Comparison: none

[Series 1: us mfm ob comp +14 wks · 13 of 120 slices shown]
[im 5/120]
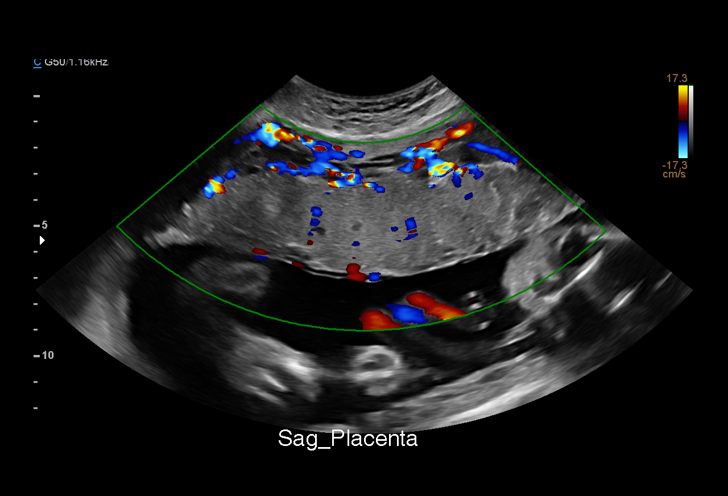
[im 14/120]
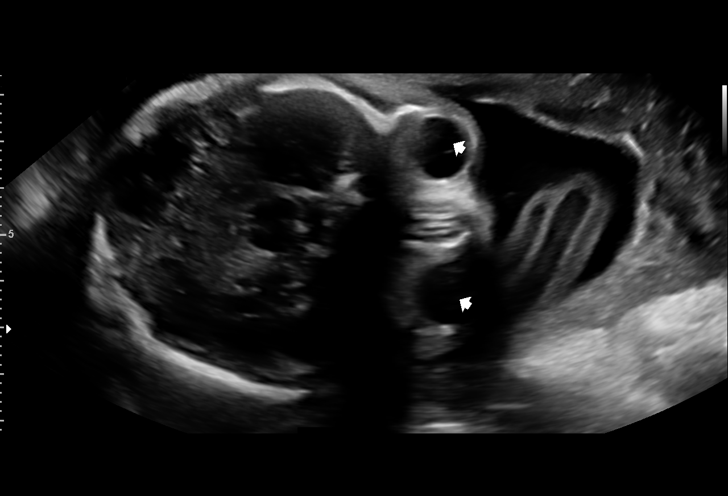
[im 23/120]
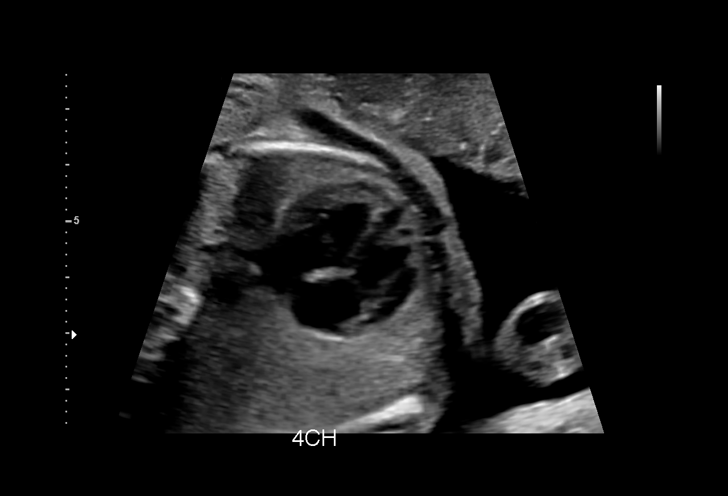
[im 31/120]
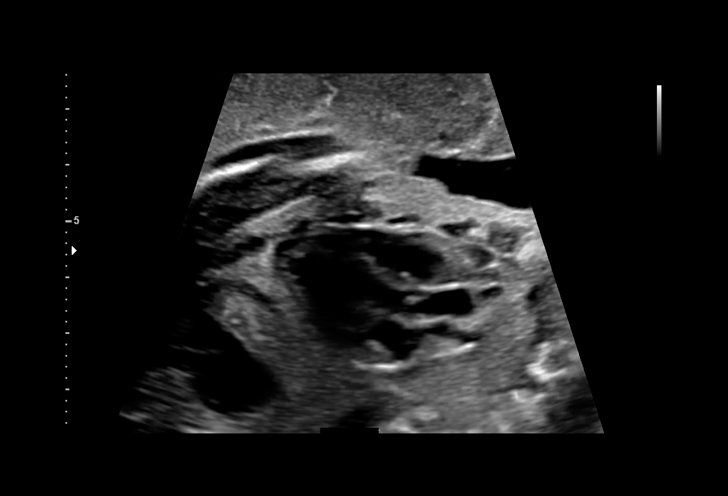
[im 40/120]
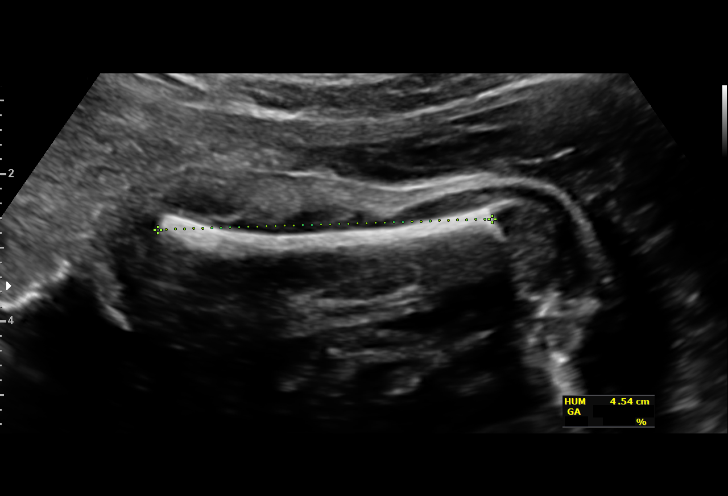
[im 49/120]
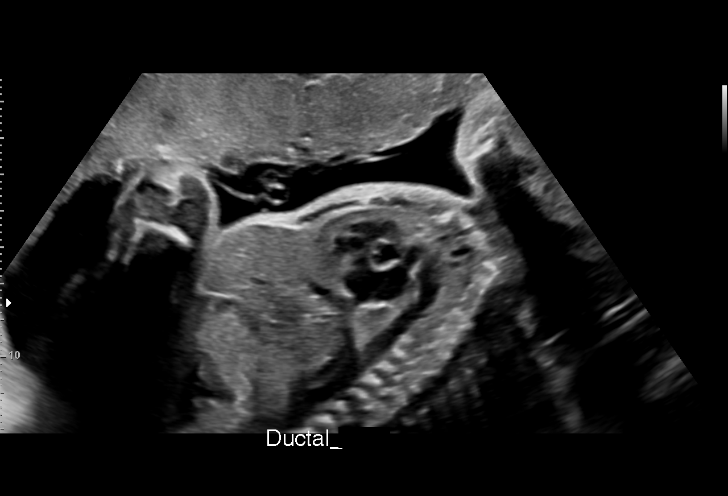
[im 62/120]
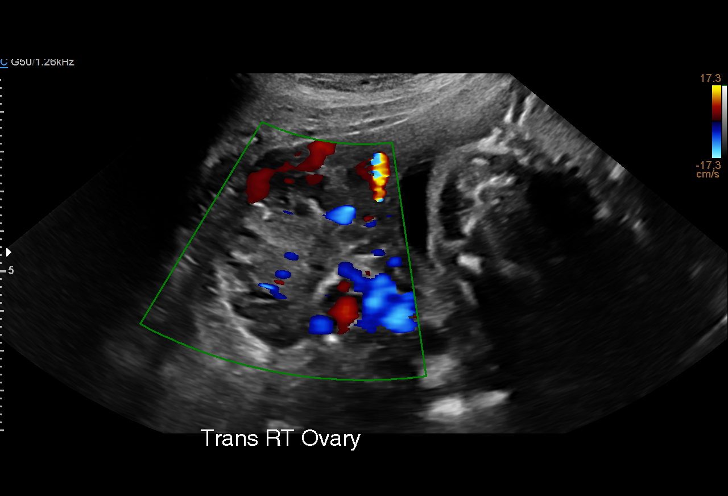
[im 71/120]
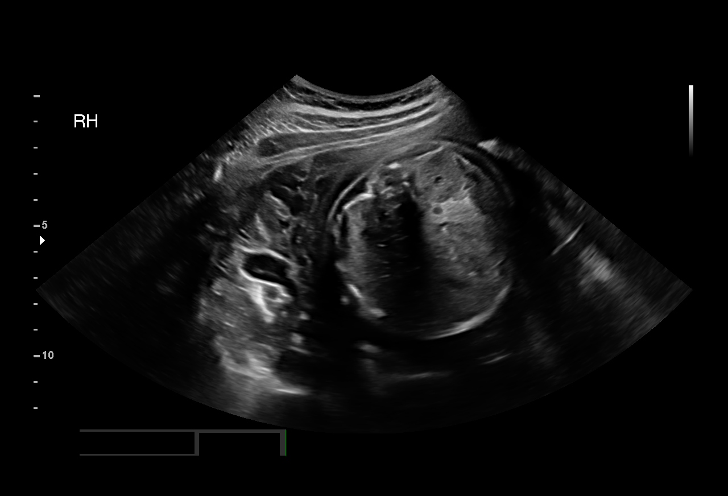
[im 80/120]
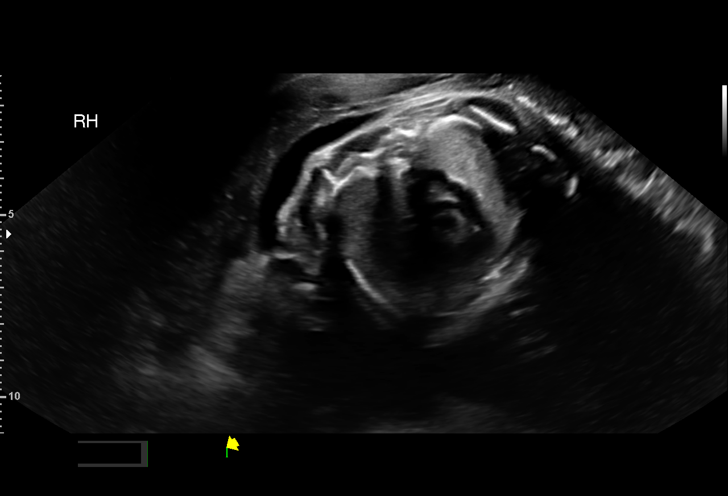
[im 89/120]
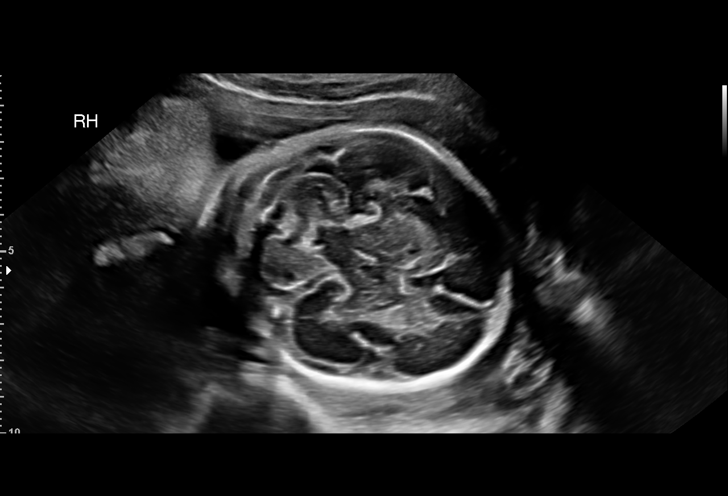
[im 97/120]
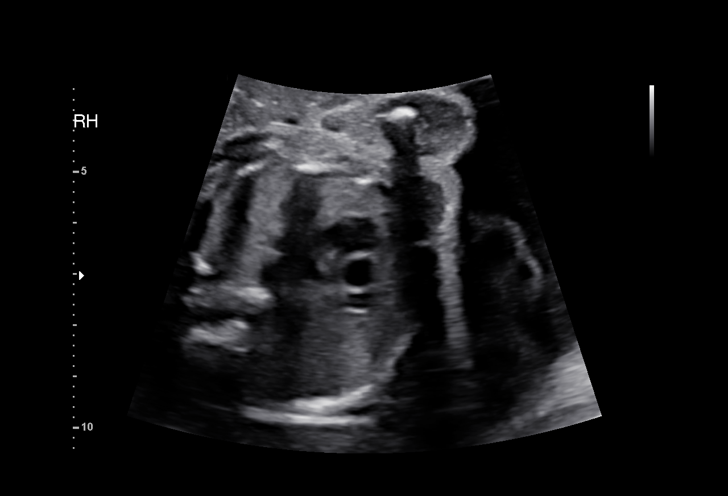
[im 106/120]
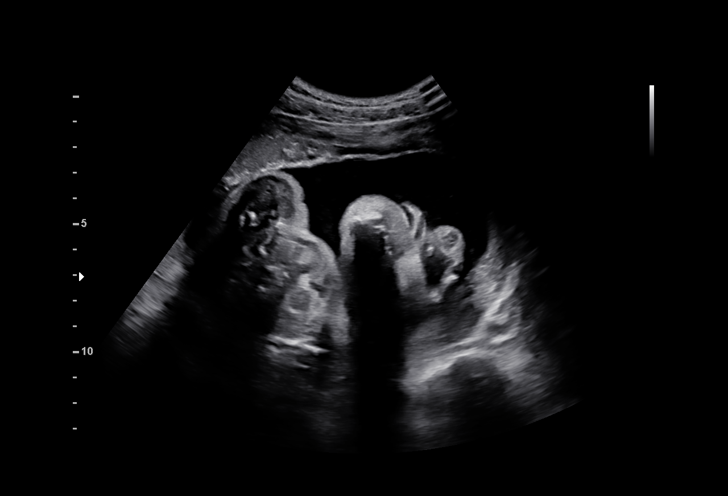
[im 115/120]
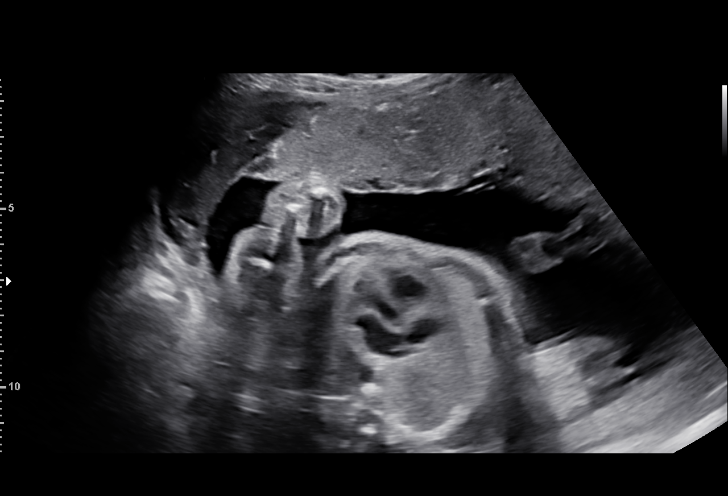

[13 of 28 positions shown; findings below may reference images not displayed]

----------------------------------------------------------------------

 ----------------------------------------------------------------------
Indications

  Fetal abnormality - other known or suspected
  Encounter for antenatal screening for
  malformations (Low Risk NIPS)
  Genetic carrier (Sickle Cell trait)
  28 weeks gestation of pregnancy
 ----------------------------------------------------------------------
Fetal Evaluation

 Num Of Fetuses:         1
 Fetal Heart Rate(bpm):  143
 Cardiac Activity:       Observed
 Presentation:           Cephalic
 Placenta:               Anterior
 P. Cord Insertion:      Visualized

 Amniotic Fluid
 AFI FV:      Within normal limits

                             Largest Pocket(cm)

Biometry

 BPD:      70.2  mm     G. Age:  28w 1d         45  %    CI:        82.01   %    70 - 86
                                                         FL/HC:      20.6   %    18.8 -
 HC:      244.6  mm     G. Age:  26w 4d          2  %    HC/AC:      0.99        1.05 -
 AC:      247.4  mm     G. Age:  29w 0d         72  %    FL/BPD:     71.9   %    71 - 87
 FL:       50.5  mm     G. Age:  27w 1d         14  %    FL/AC:      20.4   %    20 - 24
 HUM:      45.9  mm     G. Age:  27w 0d         26  %
 LV:        3.9  mm

 Est. FW:    5549  gm      2 lb 9 oz     39  %
OB History

 Gravidity:    2         Term:   0         SAB:   1
 Living:       0
Gestational Age

 LMP:           28w 0d        Date:  06/06/18                 EDD:   03/13/19
 U/S Today:     27w 5d                                        EDD:   03/15/19
 Best:          28w 0d     Det. By:  LMP  (06/06/18)          EDD:   03/13/19
Anatomy

 Cranium:               Appears normal         Aortic Arch:            Not well visualized
 Cavum:                 Appears normal         Ductal Arch:            Appears normal
 Ventricles:            Appears normal         Diaphragm:              Appears normal
 Choroid Plexus:        Appears normal         Stomach:                Appears normal, left
                                                                       sided
 Cerebellum:            Not well visualized    Abdomen:                Appears normal
 Posterior Fossa:       Not well visualized    Abdominal Wall:         Appears nml (cord
                                                                       insert, abd wall)
 Nuchal Fold:           Not applicable (>20    Cord Vessels:           Appears normal (3
                        wks GA)                                        vessel cord)
 Face:                  Appears normal         Kidneys:                Not well visualized
                        (orbits and profile)
 Lips:                  Appears normal         Bladder:                Appears normal
 Thoracic:              Appears normal         Spine:                  Not well visualized
 Heart:                 Appears normal         Upper Extremities:      Appears normal
                        (4CH, axis, and
                        situs)
 RVOT:                  Appears normal         Lower Extremities:      Appears normal
 LVOT:                  Appears normal

 Other:  Male gender. Nasal bone visualized. Technically difficult due to fetal
         position.
Comments

 This patient was seen for a detailed fetal anatomy scan.  The
 patient recently transferred her care from practice in [place].
 She denies any significant past medical history and denies
 any problems in her current pregnancy.  She is a carrier for
 the sickle cell trait.
 She had a cell free DNA test earlier in her pregnancy which
 indicated a low risk for trisomy 21, 18, and 13. A male fetus is
 predicted.
 She was informed that the fetal growth and amniotic fluid
 level were appropriate for her gestational age.
 There were no obvious fetal anomalies noted on today's
 ultrasound exam.  Today's exam was limited due to her
 advanced gestational age.
 The patient was informed that anomalies may be missed due
 to technical limitations. If the fetus is in a suboptimal position
 or maternal habitus is increased, visualization of the fetus in
 the maternal uterus may be impaired.
 Follow up as indicated.

## 2021-02-03 ENCOUNTER — Other Ambulatory Visit: Payer: Self-pay

## 2021-02-03 ENCOUNTER — Ambulatory Visit: Payer: Medicaid Other | Attending: Nurse Practitioner | Admitting: Nurse Practitioner

## 2021-02-03 ENCOUNTER — Encounter: Payer: Self-pay | Admitting: Nurse Practitioner

## 2021-02-03 VITALS — BP 106/73 | HR 77 | Ht 61.0 in | Wt 110.2 lb

## 2021-02-03 DIAGNOSIS — B079 Viral wart, unspecified: Secondary | ICD-10-CM | POA: Diagnosis not present

## 2021-02-03 DIAGNOSIS — Z7689 Persons encountering health services in other specified circumstances: Secondary | ICD-10-CM | POA: Diagnosis not present

## 2021-02-03 DIAGNOSIS — Z23 Encounter for immunization: Secondary | ICD-10-CM | POA: Diagnosis not present

## 2021-02-03 DIAGNOSIS — D649 Anemia, unspecified: Secondary | ICD-10-CM

## 2021-02-03 DIAGNOSIS — L989 Disorder of the skin and subcutaneous tissue, unspecified: Secondary | ICD-10-CM | POA: Diagnosis not present

## 2021-02-03 NOTE — Progress Notes (Signed)
Assessment & Plan:  Brenda Harvey was seen today for establish care.  Diagnoses and all orders for this visit:  Encounter to establish care  Need for vaccination against human papillomavirus -     HPV 9-valent vaccine,Recombinat  Viral wart on finger -     Ambulatory referral to Dermatology  Skin lesion of left arm -     Ambulatory referral to Dermatology  Anemia, unspecified type -     CBC   Patient has been counseled on age-appropriate routine health concerns for screening and prevention. These are reviewed and up-to-date. Referrals have been placed accordingly. Immunizations are up-to-date or declined.    Subjective:   Chief Complaint  Patient presents with   Establish Care   HPI Brenda Harvey 25 y.o. female presents to office today to establish care.  She has no significant PMH  Her concerns today include a hyperpigmented lesion on the medial upper left arm. She believes it is a mole however it appears to be a previous comedone that has now scarred.  She also has a wart on the right pinky finger which she would like removed.    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 She works an 8 hour shift. Other changes aside from sleep pattern include decreased appetite since she started working 3rd shift  Review of Systems  Constitutional:  Positive for malaise/fatigue. Negative for fever and weight loss.  HENT: Negative.  Negative for nosebleeds.   Eyes: Negative.  Negative for blurred vision, double vision and photophobia.  Respiratory: Negative.  Negative for cough and shortness of breath.   Cardiovascular: Negative.  Negative for chest pain, palpitations and leg swelling.  Gastrointestinal: Negative.  Negative for heartburn, nausea and vomiting.  Musculoskeletal: Negative.  Negative for myalgias.  Neurological: Negative.  Negative for dizziness, focal weakness,  seizures and headaches.  Psychiatric/Behavioral: Negative.  Negative for suicidal ideas.    Past Medical History:  Diagnosis Date   COVID-19 affecting pregnancy in third trimester 02/14/2019   Lab test positive for detection of COVID-19 virus 02/14/2019   0n 02/12/19   Second-degree perineal laceration during delivery 03/15/2019   Supervision of normal pregnancy, antepartum 12/06/2018    Nursing Staff Provider Office Location  [facility] Dating  9 week [place] Language   English Anatomy US  wnl Flu Vaccine   given 03/07/19 Genetic Screen  NIPS: Low risk female  AFP: late    TDaP vaccine   given 03/07/19 Hgb A1C or  GTT Early  Meets criteria but late Third trimester 2 hour wnl Rhogam     LAB RESULTS  Feeding Plan  Breastfeed Blood Type AB/Positive/-- (05/14 0000)  Contraception  Undecide   Vacuum-assisted vaginal delivery 03/15/2019    History reviewed. No pertinent surgical history.  Family History  Problem Relation Age of Onset   Diabetes Mother    Miscarriages / [place] Sister    Asthma Sister    Diabetes Maternal Uncle     Social History Reviewed with no changes to be made today.   Outpatient Medications Prior to Visit  Medication Sig Dispense Refill   acetaminophen (TYLENOL) 325 MG tablet Take 2 tablets (650 mg total) by mouth every 4 (four) hours as needed (for pain scale < 4). (Patient not taking: No sig reported) 30 tablet 0   albuterol (VENTOLIN HFA) 108 (90 Base) MCG/ACT inhaler Inhale 2 puffs into  the lungs every 6 (six) hours as needed for wheezing or shortness of breath. (Patient not taking: No sig reported) 6.7 g 0   Blood Pressure Monitoring (BLOOD PRESSURE KIT) DEVI 1 kit by Does not apply route once a week. Check BP Weekly. Large Cuff.  DX: Z13.6  Z34.86  O09.0 (Patient not taking: Reported on 02/03/2021) 1 kit 0   ibuprofen (ADVIL) 100 MG/5ML suspension Take 30 mLs (600 mg total) by mouth every 6 (six) hours. (Patient not taking: No sig reported) 237 mL 0    Prenat-FeCbn-FeAsp-Meth-FA-DHA (PRENATE MINI) 18-0.6-0.4-350 MG CAPS Take 1 capsule by mouth daily. (Patient not taking: Reported on 02/03/2021) 30 capsule 11   No facility-administered medications prior to visit.    No Known Allergies     Objective:    BP 106/73   Pulse 77   Ht '5\' 1"'  (1.549 m)   Wt 110 lb 4 oz (50 kg)   LMP 01/03/2021   SpO2 98%   Breastfeeding No   BMI 20.83 kg/m  Wt Readings from Last 3 Encounters:  02/03/21 110 lb 4 oz (50 kg)  12/19/19 116 lb (52.6 kg)  04/13/19 115 lb (52.2 kg)    Physical Exam Vitals and nursing note reviewed.  Constitutional:      Appearance: She is well-developed.  HENT:     Head: Normocephalic and atraumatic.  Cardiovascular:     Rate and Rhythm: Normal rate and regular rhythm.     Heart sounds: Normal heart sounds. No murmur heard.   No friction rub. No gallop.  Pulmonary:     Effort: Pulmonary effort is normal. No tachypnea or respiratory distress.     Breath sounds: Normal breath sounds. No decreased breath sounds, wheezing, rhonchi or rales.  Chest:     Chest wall: No tenderness.  Abdominal:     General: Bowel sounds are normal.     Palpations: Abdomen is soft.  Musculoskeletal:        General: Normal range of motion.     Cervical back: Normal range of motion.  Skin:    General: Skin is warm and dry.  Neurological:     Mental Status: She is alert and oriented to person, place, and time.     Coordination: Coordination normal.  Psychiatric:        Behavior: Behavior normal. Behavior is cooperative.        Thought Content: Thought content normal.        Judgment: Judgment normal.         Patient has been counseled extensively about nutrition and exercise as well as the importance of adherence with medications and regular follow-up. The patient was given clear instructions to go to ER or return to medical center if symptoms don't improve, worsen or new problems develop. The patient verbalized understanding.    Follow-up: Return in about 8 weeks (around 03/31/2021) for nurse visit HPV.   Gildardo Pounds, FNP-BC [facility] [place], [place]   02/03/2021, 9:16 PM

## 2021-02-04 LAB — CBC
Hematocrit: 41.3 % (ref 34.0–46.6)
Hemoglobin: 13.6 g/dL (ref 11.1–15.9)
MCH: 29.8 pg (ref 26.6–33.0)
MCHC: 32.9 g/dL (ref 31.5–35.7)
MCV: 91 fL (ref 79–97)
Platelets: 350 10*3/uL (ref 150–450)
RBC: 4.56 x10E6/uL (ref 3.77–5.28)
RDW: 11.1 % — ABNORMAL LOW (ref 11.7–15.4)
WBC: 6.3 10*3/uL (ref 3.4–10.8)

## 2021-03-31 ENCOUNTER — Other Ambulatory Visit: Payer: Self-pay

## 2021-03-31 ENCOUNTER — Ambulatory Visit: Payer: Medicaid Other | Attending: Nurse Practitioner

## 2021-03-31 DIAGNOSIS — Z23 Encounter for immunization: Secondary | ICD-10-CM | POA: Diagnosis not present

## 2021-04-01 NOTE — Progress Notes (Signed)
2nd dose Hvp Given in Right deltoid pt tolerated well.

## 2021-10-20 NOTE — Progress Notes (Deleted)
ANNUAL EXAM Patient name: Brenda Harvey MRN [id number]  Date of birth: [birth year] Chief Complaint:   No chief complaint on file.  History of Present Illness:   Brenda Harvey is a 26 y.o. G55P1011 {race:25618} female being seen today for a routine annual exam.  Current complaints: ***  No LMP recorded.   The pregnancy intention screening data noted above was reviewed. Potential methods of contraception were discussed. The patient elected to proceed with No data recorded.   Last pap ***. Results were: {Pap findings:25134}. H/O abnormal pap: {yes/yes***/no:23866} Last mammogram: ***. Results were: {normal, abnormal, n/a:23837}. Family h/o breast cancer: {yes***/no:23838} Last colonoscopy: ***. Results were: {normal, abnormal, n/a:23837}. Family h/o colorectal cancer: {yes***/no:23838}     02/03/2021    9:25 AM 12/19/2019   11:31 AM  Depression screen PHQ 2/9  Decreased Interest 0 0  Down, Depressed, Hopeless 0 0  PHQ - 2 Score 0 0  Altered sleeping 3 0  Tired, decreased energy 3 0  Change in appetite 2 0  Feeling bad or failure about yourself  0 0  Trouble concentrating 0 0  Moving slowly or fidgety/restless 0 0  Suicidal thoughts 0 0  PHQ-9 Score 8 0  Difficult doing work/chores Not difficult at all Not difficult at all        02/03/2021    9:26 AM 12/06/2018    1:49 PM  GAD 7 : Generalized Anxiety Score  Nervous, Anxious, on Edge 0 0  Control/stop worrying 1 0  Worry too much - different things 1 0  Trouble relaxing 0 0  Restless 0 0  Easily annoyed or irritable 0 0  Afraid - awful might happen 0 0  Total GAD 7 Score 2 0  Anxiety Difficulty Not difficult at all Not difficult at all     Review of Systems:   Pertinent items are noted in HPI Denies any headaches, blurred vision, fatigue, shortness of breath, chest pain, abdominal pain, abnormal vaginal discharge/itching/odor/irritation, problems with periods, bowel movements, urination, or intercourse unless  otherwise stated above. Pertinent History Reviewed:  Reviewed past medical,surgical, social and family history.  Reviewed problem list, medications and allergies. Physical Assessment:  There were no vitals filed for this visit.There is no height or weight on file to calculate BMI.        Physical Examination:   General appearance - well appearing, and in no distress  Mental status - alert, oriented to person, place, and time  Psych:  She has a normal mood and affect  Skin - warm and dry, normal color, no suspicious lesions noted  Chest - effort normal, all lung fields clear to auscultation bilaterally  Heart - normal rate and regular rhythm  Neck:  midline trachea, no thyromegaly or nodules  Breasts - breasts appear normal, no suspicious masses, no skin or nipple changes or  axillary nodes  Abdomen - soft, nontender, nondistended, no masses or organomegaly  Pelvic - VULVA: normal appearing vulva with no masses, tenderness or lesions  VAGINA: normal appearing vagina with normal color and discharge, no lesions  CERVIX: normal appearing cervix without discharge or lesions, no CMT  Thin prep pap is {Desc; done/not:10129} *** HR HPV cotesting  UTERUS: uterus is felt to be normal size, shape, consistency and nontender   ADNEXA: No adnexal masses or tenderness noted.  Rectal - normal rectal, good sphincter tone, no masses felt. Hemoccult: ***  Extremities:  No swelling or varicosities noted  Chaperone present for exam  No results found  for this or any previous visit (from the past 24 hour(s)).  Assessment & Plan:  1) Well-Woman Exam  2) ***  Labs/procedures today: ***  Mammogram: {Mammo f/u:25212::"@ 25yo"}, or sooner if problems Colonoscopy: {TCS f/u:25213::"@ 25yo"}, or sooner if problems  No orders of the defined types were placed in this encounter.   Meds: No orders of the defined types were placed in this encounter.   Follow-up: No follow-ups on file.  Myrtie Hawk, DO 10/20/2021 9:48 PM

## 2021-10-21 ENCOUNTER — Encounter: Payer: Medicaid Other | Admitting: Family Medicine

## 2021-10-21 DIAGNOSIS — Z01419 Encounter for gynecological examination (general) (routine) without abnormal findings: Secondary | ICD-10-CM

## 2021-10-24 NOTE — Progress Notes (Signed)
This encounter was created in error - please disregard.

## 2024-02-14 ENCOUNTER — Ambulatory Visit: Payer: Self-pay | Admitting: Family

## 2024-02-14 VITALS — BP 109/73 | HR 79 | Temp 98.1°F | Resp 16 | Ht 61.0 in | Wt 111.4 lb

## 2024-02-14 DIAGNOSIS — R5383 Other fatigue: Secondary | ICD-10-CM | POA: Diagnosis not present

## 2024-02-14 DIAGNOSIS — Z7689 Persons encountering health services in other specified circumstances: Secondary | ICD-10-CM

## 2024-02-14 NOTE — Progress Notes (Signed)
 Patient has been sleeping a lot and still tired when she wakes up

## 2024-02-14 NOTE — Progress Notes (Signed)
 Subjective:    Brenda Harvey - 28 y.o. female MRN [id number]  Date of birth: Mar 06, 1996  HPI  Brenda Harvey is to establish care.   Current issues and/or concerns: - States for 3 years sleeping 8 hours daily (some days more) and waking up feeling tired. States initially considered if related to working 3rd shift. States she also has a decreased appetite because of working 3rd shift.    ROS per HPI    Health Maintenance:   Health Maintenance Due  Topic Date Due   Hepatitis C Screening  Never done   Cervical Cancer Screening (Pap smear)  04/12/2022     Past Medical History: Patient Active Problem List   Diagnosis Date Noted   Supervision of other normal pregnancy, antepartum 12/19/2019   Sickle cell trait 12/06/2018      Social History   reports that she has never smoked. She has never used smokeless tobacco. She reports that she does not drink alcohol and does not use drugs.   Family History  family history includes Asthma in her sister; Diabetes in her maternal uncle and mother; Miscarriages / Stillbirths in her sister.   Medications: reviewed and updated   Objective:   Physical Exam BP 109/73   Pulse 79   Temp 98.1 F (36.7 C) (Oral)   Resp 16   Ht 5' 1 (1.549 m)   Wt 111 lb 6.4 oz (50.5 kg)   LMP 01/30/2024 (Exact Date)   SpO2 97%   BMI 21.05 kg/m   Physical Exam HENT:     Head: Normocephalic and atraumatic.     Nose: Nose normal.     Mouth/Throat:     Mouth: Mucous membranes are moist.     Pharynx: Oropharynx is clear.  Eyes:     Extraocular Movements: Extraocular movements intact.     Conjunctiva/sclera: Conjunctivae normal.     Pupils: Pupils are equal, round, and reactive to light.  Cardiovascular:     Rate and Rhythm: Normal rate and regular rhythm.     Pulses: Normal pulses.     Heart sounds: Normal heart sounds.  Pulmonary:     Effort: Pulmonary effort is normal.     Breath sounds: Normal breath sounds.  Musculoskeletal:         General: Normal range of motion.     Cervical back: Normal range of motion and neck supple.  Neurological:     General: No focal deficit present.     Mental Status: She is alert and oriented to person, place, and time.  Psychiatric:        Mood and Affect: Mood normal.        Behavior: Behavior normal.        Assessment & Plan:  1. Encounter to establish care (Primary) - Patient presents today to establish care. During the interim follow-up with primary provider as scheduled.  - Return for annual physical examination, labs, and health maintenance. Arrive fasting meaning having no food for at least 8 hours prior to appointment. You may have only water or black coffee. Please take scheduled medications as normal.  2. Fatigue, unspecified type - Routine screening.  - CBC - CMP14+EGFR - Hemoglobin A1c - TSH - Vitamin D , 25-hydroxy    Patient was given clear instructions to go to Emergency Department or return to medical center if symptoms don't improve, worsen, or new problems develop.The patient verbalized understanding.  I discussed the assessment and treatment plan with the patient. The patient was provided  an opportunity to ask questions and all were answered. The patient agreed with the plan and demonstrated an understanding of the instructions.   The patient was advised to call back or seek an in-person evaluation if the symptoms worsen or if the condition fails to improve as anticipated.    Greig Drones, NP 02/14/2024, 10:10 AM Primary Care at [facility]

## 2024-02-15 ENCOUNTER — Ambulatory Visit: Payer: Self-pay | Admitting: Family

## 2024-02-15 DIAGNOSIS — E559 Vitamin D deficiency, unspecified: Secondary | ICD-10-CM

## 2024-02-15 LAB — VITAMIN D 25 HYDROXY (VIT D DEFICIENCY, FRACTURES): Vit D, 25-Hydroxy: 22.1 ng/mL — ABNORMAL LOW (ref 30.0–100.0)

## 2024-02-15 LAB — CBC
Hematocrit: 41.4 % (ref 34.0–46.6)
Hemoglobin: 13.6 g/dL (ref 11.1–15.9)
MCH: 31.1 pg (ref 26.6–33.0)
MCHC: 32.9 g/dL (ref 31.5–35.7)
MCV: 95 fL (ref 79–97)
Platelets: 363 x10E3/uL (ref 150–450)
RBC: 4.38 x10E6/uL (ref 3.77–5.28)
RDW: 11.1 % — ABNORMAL LOW (ref 11.7–15.4)
WBC: 6.7 x10E3/uL (ref 3.4–10.8)

## 2024-02-15 LAB — HEMOGLOBIN A1C
Est. average glucose Bld gHb Est-mCnc: 82 mg/dL
Hgb A1c MFr Bld: 4.5 % — ABNORMAL LOW (ref 4.8–5.6)

## 2024-02-15 LAB — CMP14+EGFR
ALT: 9 IU/L (ref 0–32)
AST: 15 IU/L (ref 0–40)
Albumin: 4.5 g/dL (ref 4.0–5.0)
Alkaline Phosphatase: 36 IU/L — ABNORMAL LOW (ref 41–116)
BUN/Creatinine Ratio: 10 (ref 9–23)
BUN: 7 mg/dL (ref 6–20)
Bilirubin Total: 0.6 mg/dL (ref 0.0–1.2)
CO2: 24 mmol/L (ref 20–29)
Calcium: 9.8 mg/dL (ref 8.7–10.2)
Chloride: 99 mmol/L (ref 96–106)
Creatinine, Ser: 0.7 mg/dL (ref 0.57–1.00)
Globulin, Total: 2.7 g/dL (ref 1.5–4.5)
Glucose: 107 mg/dL — ABNORMAL HIGH (ref 70–99)
Potassium: 3.6 mmol/L (ref 3.5–5.2)
Sodium: 137 mmol/L (ref 134–144)
Total Protein: 7.2 g/dL (ref 6.0–8.5)
eGFR: 122 mL/min/1.73 (ref >59)

## 2024-02-15 LAB — TSH: TSH: 1.61 u[IU]/mL (ref 0.450–4.500)

## 2024-02-15 MED ORDER — VITAMIN D (ERGOCALCIFEROL) 1.25 MG (50000 UNIT) PO CAPS: 50000.0000 [IU] | ORAL_CAPSULE | ORAL | 0 refills | Status: AC

## 2024-03-21 ENCOUNTER — Telehealth: Admitting: Family Medicine

## 2024-03-21 DIAGNOSIS — J069 Acute upper respiratory infection, unspecified: Secondary | ICD-10-CM | POA: Diagnosis not present

## 2024-03-21 MED ORDER — PROMETHAZINE-DM 6.25-15 MG/5ML PO SYRP: 5.0000 mL | ORAL_SOLUTION | Freq: Four times a day (QID) | ORAL | 0 refills | Status: AC | PRN

## 2024-03-21 MED ORDER — LIDOCAINE VISCOUS HCL 2 % MT SOLN: 15.0000 mL | OROMUCOSAL | 0 refills | Status: AC | PRN

## 2024-03-21 MED ORDER — FLUTICASONE PROPIONATE 50 MCG/ACT NA SUSP: 2.0000 | Freq: Every day | NASAL | 6 refills | Status: AC

## 2024-03-21 NOTE — Progress Notes (Signed)

## 2024-04-25 ENCOUNTER — Encounter: Admitting: Family

## 2024-04-25 NOTE — Progress Notes (Signed)
 Erroneous encounter-disregard

## 2024-08-29 ENCOUNTER — Encounter: Admitting: Family
